# Patient Record
Sex: Female | Born: 1955 | Race: White | Hispanic: No | Marital: Married | Smoking: Never smoker
Health system: Southern US, Community
[De-identification: ages and names within clinical notes are randomized; demographics above are authoritative.]

## PROBLEM LIST (undated history)

## (undated) DIAGNOSIS — F419 Anxiety disorder, unspecified: Secondary | ICD-10-CM

## (undated) DIAGNOSIS — N809 Endometriosis, unspecified: Secondary | ICD-10-CM

## (undated) DIAGNOSIS — Z9889 Other specified postprocedural states: Secondary | ICD-10-CM

## (undated) HISTORY — PX: TONSILLECTOMY: SUR1361

## (undated) HISTORY — PX: ABDOMINAL HYSTERECTOMY: SHX81

---

## 1997-11-20 ENCOUNTER — Ambulatory Visit (HOSPITAL_COMMUNITY): Admission: RE | Admit: 1997-11-20 | Discharge: 1997-11-20 | Payer: Self-pay | Admitting: Obstetrics and Gynecology

## 1998-03-02 ENCOUNTER — Other Ambulatory Visit: Admission: RE | Admit: 1998-03-02 | Discharge: 1998-03-02 | Payer: Self-pay | Admitting: Obstetrics and Gynecology

## 1999-03-27 ENCOUNTER — Other Ambulatory Visit: Admission: RE | Admit: 1999-03-27 | Discharge: 1999-03-27 | Payer: Self-pay | Admitting: Obstetrics and Gynecology

## 1999-04-23 ENCOUNTER — Other Ambulatory Visit: Admission: RE | Admit: 1999-04-23 | Discharge: 1999-04-23 | Payer: Self-pay | Admitting: Obstetrics and Gynecology

## 2000-12-07 ENCOUNTER — Other Ambulatory Visit: Admission: RE | Admit: 2000-12-07 | Discharge: 2000-12-07 | Payer: Self-pay | Admitting: Obstetrics and Gynecology

## 2000-12-07 ENCOUNTER — Encounter (INDEPENDENT_AMBULATORY_CARE_PROVIDER_SITE_OTHER): Payer: Self-pay

## 2001-03-04 ENCOUNTER — Observation Stay (HOSPITAL_COMMUNITY): Admission: RE | Admit: 2001-03-04 | Discharge: 2001-03-05 | Payer: Self-pay | Admitting: Obstetrics and Gynecology

## 2003-02-09 ENCOUNTER — Other Ambulatory Visit: Admission: RE | Admit: 2003-02-09 | Discharge: 2003-02-09 | Payer: Self-pay | Admitting: Obstetrics and Gynecology

## 2003-11-06 ENCOUNTER — Ambulatory Visit (HOSPITAL_COMMUNITY): Admission: RE | Admit: 2003-11-06 | Discharge: 2003-11-06 | Payer: Self-pay | Admitting: Family Medicine

## 2004-02-13 ENCOUNTER — Other Ambulatory Visit: Admission: RE | Admit: 2004-02-13 | Discharge: 2004-02-13 | Payer: Self-pay | Admitting: Obstetrics and Gynecology

## 2005-01-14 ENCOUNTER — Encounter: Admission: RE | Admit: 2005-01-14 | Discharge: 2005-01-14 | Payer: Self-pay | Admitting: Specialist

## 2005-06-20 ENCOUNTER — Other Ambulatory Visit: Admission: RE | Admit: 2005-06-20 | Discharge: 2005-06-20 | Payer: Self-pay | Admitting: Obstetrics and Gynecology

## 2005-06-25 ENCOUNTER — Encounter: Admission: RE | Admit: 2005-06-25 | Discharge: 2005-06-25 | Payer: Self-pay | Admitting: Obstetrics and Gynecology

## 2005-07-11 ENCOUNTER — Other Ambulatory Visit: Admission: RE | Admit: 2005-07-11 | Discharge: 2005-07-11 | Payer: Self-pay | Admitting: Obstetrics and Gynecology

## 2006-07-17 ENCOUNTER — Encounter: Admission: RE | Admit: 2006-07-17 | Discharge: 2006-07-17 | Payer: Self-pay | Admitting: Obstetrics and Gynecology

## 2007-08-31 ENCOUNTER — Encounter: Admission: RE | Admit: 2007-08-31 | Discharge: 2007-08-31 | Payer: Self-pay | Admitting: Obstetrics and Gynecology

## 2008-07-14 ENCOUNTER — Encounter: Admission: RE | Admit: 2008-07-14 | Discharge: 2008-07-14 | Payer: Self-pay | Admitting: Obstetrics and Gynecology

## 2008-07-17 ENCOUNTER — Encounter: Admission: RE | Admit: 2008-07-17 | Discharge: 2008-07-17 | Payer: Self-pay | Admitting: Obstetrics and Gynecology

## 2009-11-21 ENCOUNTER — Encounter: Admission: RE | Admit: 2009-11-21 | Discharge: 2009-11-21 | Payer: Self-pay | Admitting: Obstetrics and Gynecology

## 2010-08-08 ENCOUNTER — Other Ambulatory Visit: Payer: Self-pay | Admitting: Family Medicine

## 2010-08-08 DIAGNOSIS — N6310 Unspecified lump in the right breast, unspecified quadrant: Secondary | ICD-10-CM

## 2010-08-12 ENCOUNTER — Ambulatory Visit
Admission: RE | Admit: 2010-08-12 | Discharge: 2010-08-12 | Disposition: A | Discharge status: Home / Self Care | Payer: Medicare HMO | Source: Ambulatory Visit | Attending: Family Medicine | Admitting: Family Medicine

## 2010-08-12 ENCOUNTER — Other Ambulatory Visit: Payer: Self-pay | Admitting: Family Medicine

## 2010-08-12 DIAGNOSIS — N6310 Unspecified lump in the right breast, unspecified quadrant: Secondary | ICD-10-CM

## 2010-08-30 NOTE — H&P (Signed)
[facility]  Patient:    Kristy Murillo, Kristy Murillo Visit Number: [id number] MRN: [id number]          Service Type: Attending:  Juluis Mire, M.D. Dictated by:   Juluis Mire, M.D. Adm. Date:  03/04/01                           History and Physical  REASON FOR ADMISSION:         The patient is a 55 year old gravida 4 para 2 abortus 2 married white female who presents for laparoscopically-assisted vaginal hysterectomy for management of abnormal uterine bleeding.  HISTORY OF PRESENT ILLNESS:   The patient has had a history of increasing menstrual irregularity.  Her cycles are now every 21 days.  She reports seven days of flow.  Five days are described as heavy.  The amount of flow and its frequency is significant and incapacitating for her.  Her hemoglobin has remained relatively stable.  During this time she is having increasing pain and discomfort associated with her cycle.  She had been laparoscoped back in 1999 with finding of mild pelvic endometriosis.  Her mother does have a history of breast cancer and the patient is somewhat concerned about using hormonal agents such as birth control pills.  Due to the polymenorrhea and menorrhagia she now presents for laparoscopically-assisted vaginal hysterectomy.  Workup including an ultrasound have basically been unremarkable.  Presumptively, we are dealing with recurrent endometriosis and/or uterine adenomyosis.  ALLERGIES:                    No known drug allergies.  MEDICATIONS:                  Zoloft.  PAST MEDICAL HISTORY:         Usual childhood diseases without any significant sequelae.  PREVIOUS SURGICAL HISTORY:    She had a previous laparoscopy/hysteroscopy done in 1992 with the finding of endometriosis.  She has had also a tonsillectomy and adenoidectomy in September 1986.  OBSTETRICAL HISTORY:          She had a TAB in 1980, spontaneous vaginal delivery in 1988, spontaneous abortion in 1991, and  then another spontaneous vaginal delivery in 1993.  SOCIAL HISTORY:               No tobacco or alcohol use.  FAMILY HISTORY:               Noncontributory.  REVIEW OF SYSTEMS:            Noncontributory.  PHYSICAL EXAMINATION:  VITAL SIGNS:                  Patient is afebrile with stable vital signs.  HEENT:                        Patient normocephalic.  Pupils equal, round, and reactive to light and accommodation.  Extraocular movements were intact. Sclerae and conjunctivae were clear.  Oropharynx clear.  NECK:                         Without thyromegaly.  BREASTS:                      No discrete masses.  LUNGS:  Clear.  CARDIOVASCULAR:               Regular rhythm and rate without murmurs or gallops heard.  ABDOMEN:                      Benign.  No mass or organomegaly or tenderness.  PELVIC:                       Normal external genitalia, vaginal mucosa is clear.  Cervix unremarkable.  Uterus upper limits of normal size.  Adnexa unremarkable.  Rectovaginal exam is clear.  EXTREMITIES:                  Trace edema.  NEUROLOGIC:                   Grossly within normal limits.  IMPRESSION:                   Menorrhagia and polymenorrhea probably secondary to adenomyosis and/or pelvic endometriosis.  PLAN:                         The patient to undergo laparoscopically-assisted vaginal hysterectomy.  At her request, the ovaries will be left in place.  She does understand the rare risk of ovarian cancer and that when ovaries are left in place there can continue to be pelvic pain associated with persistent endometriosis.  The risks have been discussed, including the risk of anesthesia; the risk of infection; the risk of hemorrhage that could require transfusion with the risk of AIDS or hepatitis; the risk of injury to adjacent organs including bladder, bowel, or blood vessels that could require further exploratory surgery; the risk of deep venous  thrombosis and pulmonary embolus. The patient professed an understanding of the indications and risks. Dictated by:   Juluis Mire, M.D. Attending:  Juluis Mire, M.D. DD:  03/04/01 TD:  03/04/01 Job: [id number]

## 2010-08-30 NOTE — Op Note (Signed)
[facility]  Patient:    Kristy Murillo, Kristy Murillo Visit Number: [id number] MRN: [id number]          Service Type: OBV Location: [id number] Attending Physician:  Frederich Balding Dictated by:   Juluis Mire, M.D. Proc. Date: 03/04/01 Admit Date:  03/04/2001                             Operative Report  PREOPERATIVE DIAGNOSES:       Pelvic endometriosis with abnormal uterine bleeding.  POSTOPERATIVE DIAGNOSES:      Pelvic endometriosis with abnormal uterine bleeding.  PROCEDURE:                    Laparoscopic assisted vaginal hysterectomy.  SURGEON:                      Juluis Mire, M.D.  ASSISTANT:                    Duke Salvia. Marcelle Overlie, M.D.  ANESTHESIA:                   General endotracheal.  ESTIMATED BLOOD LOSS:         200-300 cc.  PACKS AND DRAINS:             None.  INTRAOPERATIVE BLOOD PLACED:  None.  COMPLICATIONS:                None.  INDICATIONS:                  Are as dictated in the history and physical.  PROCEDURE:                    Patient taken to the OR and placed in supine position.  After a satisfactory level of general endotracheal anesthesia was obtained the patient was placed in the dorsal lithotomy position using Allen stirrups.  The abdomen, perineum, and vagina were prepped out with Betadine and draped as a sterile field.  Bladder was emptied by in-and-out catheterization.  A Hulka tenaculum was then put in place.  A subumbilical incision made with the knife.  The Veress needle was introduced into the abdominal cavity.  The abdomen was insufflated with approximately 3 L of carbon dioxide.  The operating laparoscope was introduced.  There was no evidence of injury to adjacent organs.  A 5 mm trocar was put in place in the suprapubic area in the left lower quadrant.  We made sure to miss the epigastric vessels.  The uterus was upper limits of normal size.  Tubes and ovaries were unremarkable.  She had  several implants of endometriosis at the cul-de-sac and one on the bladder area.  Appendix was visualized and noted to be normal.  Upper abdomen including the liver and tip of the gallbladder were clear.  She did have some pericecal adhesions, but again, the appendix was normal.  At this point in time a 5 mm laparoscope was put in place in the left lower port.  The LigaSure was then brought through the subumbilical port.  We then successfully cauterize and incise both utero-ovarian ligaments on each side as well as the round ligament.  With this we had good separation of the adnexa. We reinserted the subumbilical scope.  We had excellent hemostasis and separation of the adnexa.  The ureters were easily followed  in the course in the pelvis and noted to be uninvolved and the bladder was well out of the way. At this point in time all instruments were removed from the ports.  The abdomen was deflated of its carbon dioxide.  The patients legs were repositioned.  The Hulka tenaculum was then removed. A weighted speculum was placed in the vaginal vault.  Posterior lip of the cervix was grasped with a Christella Hartigan tenaculum.  Cul-de-sac was entered sharply. Both uterosacral ligaments were clamped, cut, and suture ligated with 0 Vicryl.  The reflection of the vaginal mucosa anteriorly was incised and the bladder was dissected superiorly.  Paracervical tissue was then clamped, cut, and suture ligated with 0 Vicryl.  The vesicouterine space was entered sharply and the retractor was put in place to retract the bladder superiorly.  Using the clamp, cut, and tie technique with suture ligatures of 0 Vicryl the parametrium was serially separated from the sides of the uterus.  The uterus was then flipped.  The remaining pedicle was clamped and cut.  Uterus was passed off the operative field.  Held pedicles secured with free ties of 0 Vicryl.  Uterosacral plication stitch of 0 Vicryl was put in place and  tied down and the held uterosacral ligaments were also tied together.  The vaginal cuff was then closed with figure-of-eights of 0 Vicryl in a vertical fashion. We had good hemostasis.  A Foley was placed to straight drain with retrieval of adequate amount of clear urine.  Sponge and sponge stick was then placed in the vaginal vault.  The legs were repositioned.  Abdomen was reinflated of its carbon dioxide.  Laparoscope was reintroduced along with suction irrigation.  She had some slight oozing from the vaginal cuff brought under control with the bipolar.  The adnexa appeared to be hemostatically intact.  There were a couple implants of endometriosis in the cul-de-sac that we cauterized.  We then instilled some Marcaine into the pelvic cavity.  The abdomen was deflated of its carbon dioxide and all trocars removed.  Subumbilical incision closed with interrupted subcuticulars of 4-0 Vicryl.  Suprapubic incision closed with Steri-Strips.  Sponge and sponge stick was removed from the vaginal vault.  Patient taken out of the dorsal lithotomy position.  Once alert and extubated transferred to recovery room in good condition.  Sponge, instrument, needle counts reported as correct by circulated nurse x 2. Dictated by:   Juluis Mire, M.D. Attending Physician:  Frederich Balding DD:  03/04/01 TD:  03/04/01 Job: [id number]

## 2010-08-30 NOTE — Discharge Summary (Signed)
[facility] [place]  Patient:    Kristy Murillo, Kristy Murillo Visit Number: [id number] MRN: [id number]          Service Type: OBV Location: 9300 [id number] Attending Physician:  Frederich Balding Dictated by:   Juluis Mire, M.D. Admit Date:  03/04/2001 Disc. Date: 03/05/01                             Discharge Summary  ADMISSION DIAGNOSIS:  Pelvic endometriosis.  DISCHARGE DIAGNOSES:  Pelvic endometriosis.  OPERATION:  Laparoscopic assisted vaginal hysterectomy.  For complete history and physical, please see dictated note.  HOSPITAL COURSE:  The patient underwent the above noted surgery.  Pathology is still pending.  Postoperatively did well.  Postoperative hemoglobin is 10.3. Discharged home on first postoperative day.  At that time was afebrile with stable vital signs.  Abdominal examination was benign.  No active vaginal bleeding, urine output had been adequate.  COMPLICATIONS:  None.  CONDITION ON DISCHARGE:  Discharged home in stable condition.  DISPOSITION:  The patient is to avoid heavy lifting, vaginal entrance, or driving a car.  She is to watch for signs of infection, nausea, vomiting, increasing abdominal pain, or active vaginal bleeding.  FOLLOWUP:  In the office in one week.  DISCHARGE MEDICATIONS: 1. Tylox p.r.n. pain. 2. Phenergan p.r.n. nausea. Dictated by:   Juluis Mire, M.D. Attending Physician:  Frederich Balding DD:  03/05/01 TD:  03/05/01 Job: [id number]

## 2010-12-20 ENCOUNTER — Other Ambulatory Visit: Payer: Self-pay | Admitting: Obstetrics and Gynecology

## 2010-12-20 DIAGNOSIS — N6009 Solitary cyst of unspecified breast: Secondary | ICD-10-CM

## 2010-12-24 ENCOUNTER — Ambulatory Visit
Admission: RE | Admit: 2010-12-24 | Discharge: 2010-12-24 | Disposition: A | Discharge status: Home / Self Care | Payer: Private Health Insurance - Indemnity | Source: Ambulatory Visit | Attending: Obstetrics and Gynecology | Admitting: Obstetrics and Gynecology

## 2010-12-24 ENCOUNTER — Other Ambulatory Visit: Payer: Self-pay | Admitting: Obstetrics and Gynecology

## 2010-12-24 DIAGNOSIS — N6009 Solitary cyst of unspecified breast: Secondary | ICD-10-CM

## 2011-06-27 ENCOUNTER — Ambulatory Visit
Admission: RE | Admit: 2011-06-27 | Discharge: 2011-06-27 | Disposition: A | Discharge status: Home / Self Care | Payer: Self-pay | Source: Ambulatory Visit | Attending: Obstetrics and Gynecology | Admitting: Obstetrics and Gynecology

## 2011-06-27 ENCOUNTER — Other Ambulatory Visit: Payer: Self-pay | Admitting: Obstetrics and Gynecology

## 2011-06-27 DIAGNOSIS — N63 Unspecified lump in unspecified breast: Secondary | ICD-10-CM

## 2011-07-11 ENCOUNTER — Other Ambulatory Visit: Payer: BC Managed Care – PPO

## 2011-07-17 ENCOUNTER — Ambulatory Visit
Admission: RE | Admit: 2011-07-17 | Discharge: 2011-07-17 | Disposition: A | Discharge status: Home / Self Care | Payer: BC Managed Care – PPO | Source: Ambulatory Visit | Attending: Obstetrics and Gynecology | Admitting: Obstetrics and Gynecology

## 2011-07-17 DIAGNOSIS — N63 Unspecified lump in unspecified breast: Secondary | ICD-10-CM

## 2012-06-25 ENCOUNTER — Other Ambulatory Visit: Payer: Self-pay

## 2012-06-25 DIAGNOSIS — Z1231 Encounter for screening mammogram for malignant neoplasm of breast: Secondary | ICD-10-CM

## 2012-07-29 ENCOUNTER — Ambulatory Visit
Admission: RE | Admit: 2012-07-29 | Discharge: 2012-07-29 | Disposition: A | Discharge status: Home / Self Care | Payer: BC Managed Care – PPO | Source: Ambulatory Visit

## 2012-07-29 DIAGNOSIS — Z1231 Encounter for screening mammogram for malignant neoplasm of breast: Secondary | ICD-10-CM

## 2013-12-30 ENCOUNTER — Other Ambulatory Visit: Payer: Self-pay | Admitting: Family Medicine

## 2013-12-30 ENCOUNTER — Ambulatory Visit
Admission: RE | Admit: 2013-12-30 | Discharge: 2013-12-30 | Disposition: A | Discharge status: Home / Self Care | Payer: BC Managed Care – PPO | Source: Ambulatory Visit | Attending: Family Medicine | Admitting: Family Medicine

## 2013-12-30 DIAGNOSIS — M79645 Pain in left finger(s): Secondary | ICD-10-CM

## 2014-01-17 ENCOUNTER — Other Ambulatory Visit: Payer: Self-pay

## 2014-01-17 DIAGNOSIS — Z1239 Encounter for other screening for malignant neoplasm of breast: Secondary | ICD-10-CM

## 2014-01-23 ENCOUNTER — Ambulatory Visit
Admission: RE | Admit: 2014-01-23 | Discharge: 2014-01-23 | Disposition: A | Discharge status: Home / Self Care | Payer: BC Managed Care – PPO | Source: Ambulatory Visit

## 2014-01-23 DIAGNOSIS — Z1239 Encounter for other screening for malignant neoplasm of breast: Secondary | ICD-10-CM

## 2015-08-09 DIAGNOSIS — D519 Vitamin B12 deficiency anemia, unspecified: Secondary | ICD-10-CM | POA: Diagnosis not present

## 2015-08-09 DIAGNOSIS — E7800 Pure hypercholesterolemia, unspecified: Secondary | ICD-10-CM | POA: Diagnosis not present

## 2015-08-09 DIAGNOSIS — E039 Hypothyroidism, unspecified: Secondary | ICD-10-CM | POA: Diagnosis not present

## 2015-08-09 DIAGNOSIS — E279 Disorder of adrenal gland, unspecified: Secondary | ICD-10-CM | POA: Diagnosis not present

## 2015-10-12 DIAGNOSIS — T148 Other injury of unspecified body region: Secondary | ICD-10-CM | POA: Diagnosis not present

## 2015-10-13 DIAGNOSIS — S0011XA Contusion of right eyelid and periocular area, initial encounter: Secondary | ICD-10-CM | POA: Diagnosis not present

## 2016-02-18 DIAGNOSIS — M9903 Segmental and somatic dysfunction of lumbar region: Secondary | ICD-10-CM | POA: Diagnosis not present

## 2016-02-18 DIAGNOSIS — M9905 Segmental and somatic dysfunction of pelvic region: Secondary | ICD-10-CM | POA: Diagnosis not present

## 2016-02-18 DIAGNOSIS — M9901 Segmental and somatic dysfunction of cervical region: Secondary | ICD-10-CM | POA: Diagnosis not present

## 2016-02-18 DIAGNOSIS — M5126 Other intervertebral disc displacement, lumbar region: Secondary | ICD-10-CM | POA: Diagnosis not present

## 2016-03-21 DIAGNOSIS — D2262 Melanocytic nevi of left upper limb, including shoulder: Secondary | ICD-10-CM | POA: Diagnosis not present

## 2016-03-21 DIAGNOSIS — L570 Actinic keratosis: Secondary | ICD-10-CM | POA: Diagnosis not present

## 2016-03-21 DIAGNOSIS — L821 Other seborrheic keratosis: Secondary | ICD-10-CM | POA: Diagnosis not present

## 2016-03-21 DIAGNOSIS — L819 Disorder of pigmentation, unspecified: Secondary | ICD-10-CM | POA: Diagnosis not present

## 2016-03-24 DIAGNOSIS — Z1231 Encounter for screening mammogram for malignant neoplasm of breast: Secondary | ICD-10-CM | POA: Diagnosis not present

## 2016-03-24 DIAGNOSIS — Z01419 Encounter for gynecological examination (general) (routine) without abnormal findings: Secondary | ICD-10-CM | POA: Diagnosis not present

## 2016-03-24 DIAGNOSIS — Z6821 Body mass index (BMI) 21.0-21.9, adult: Secondary | ICD-10-CM | POA: Diagnosis not present

## 2016-04-03 ENCOUNTER — Other Ambulatory Visit: Payer: Self-pay | Admitting: Obstetrics and Gynecology

## 2016-04-03 DIAGNOSIS — Z803 Family history of malignant neoplasm of breast: Secondary | ICD-10-CM

## 2016-04-04 ENCOUNTER — Ambulatory Visit
Admission: RE | Admit: 2016-04-04 | Discharge: 2016-04-04 | Disposition: A | Discharge status: Home / Self Care | Payer: BLUE CROSS/BLUE SHIELD | Source: Ambulatory Visit | Attending: Obstetrics and Gynecology | Admitting: Obstetrics and Gynecology

## 2016-04-04 DIAGNOSIS — N6011 Diffuse cystic mastopathy of right breast: Secondary | ICD-10-CM | POA: Diagnosis not present

## 2016-04-04 DIAGNOSIS — N6012 Diffuse cystic mastopathy of left breast: Secondary | ICD-10-CM | POA: Diagnosis not present

## 2016-04-04 DIAGNOSIS — Z803 Family history of malignant neoplasm of breast: Secondary | ICD-10-CM

## 2016-04-04 MED ORDER — GADOBENATE DIMEGLUMINE 529 MG/ML IV SOLN
15.0000 mL | Freq: Once | INTRAVENOUS | Status: AC | PRN
  Administered 2016-04-04: 13 mL via INTRAVENOUS

## 2016-05-01 ENCOUNTER — Emergency Department (HOSPITAL_COMMUNITY): Payer: BLUE CROSS/BLUE SHIELD | Admitting: Anesthesiology

## 2016-05-01 ENCOUNTER — Encounter (HOSPITAL_COMMUNITY): Admission: EM | Disposition: A | Discharge status: Home / Self Care | Payer: Self-pay | Source: Home / Self Care

## 2016-05-01 ENCOUNTER — Encounter (HOSPITAL_COMMUNITY): Payer: Self-pay | Admitting: Emergency Medicine

## 2016-05-01 ENCOUNTER — Inpatient Hospital Stay (HOSPITAL_COMMUNITY)
Admission: EM | Admit: 2016-05-01 | Discharge: 2016-05-06 | DRG: 330 | Disposition: A | Discharge status: Home / Self Care | Payer: BLUE CROSS/BLUE SHIELD | Attending: Surgery | Admitting: Surgery

## 2016-05-01 ENCOUNTER — Other Ambulatory Visit: Payer: Self-pay | Admitting: Internal Medicine

## 2016-05-01 ENCOUNTER — Emergency Department (HOSPITAL_COMMUNITY): Payer: BLUE CROSS/BLUE SHIELD

## 2016-05-01 DIAGNOSIS — R1110 Vomiting, unspecified: Secondary | ICD-10-CM | POA: Diagnosis not present

## 2016-05-01 DIAGNOSIS — F419 Anxiety disorder, unspecified: Secondary | ICD-10-CM | POA: Diagnosis not present

## 2016-05-01 DIAGNOSIS — Z90710 Acquired absence of both cervix and uterus: Secondary | ICD-10-CM

## 2016-05-01 DIAGNOSIS — Z791 Long term (current) use of non-steroidal anti-inflammatories (NSAID): Secondary | ICD-10-CM

## 2016-05-01 DIAGNOSIS — K562 Volvulus: Secondary | ICD-10-CM | POA: Diagnosis not present

## 2016-05-01 DIAGNOSIS — R112 Nausea with vomiting, unspecified: Secondary | ICD-10-CM | POA: Diagnosis not present

## 2016-05-01 DIAGNOSIS — D62 Acute posthemorrhagic anemia: Secondary | ICD-10-CM | POA: Diagnosis not present

## 2016-05-01 DIAGNOSIS — R109 Unspecified abdominal pain: Secondary | ICD-10-CM | POA: Diagnosis not present

## 2016-05-01 DIAGNOSIS — K5900 Constipation, unspecified: Secondary | ICD-10-CM | POA: Diagnosis not present

## 2016-05-01 DIAGNOSIS — K5669 Other partial intestinal obstruction: Secondary | ICD-10-CM | POA: Diagnosis not present

## 2016-05-01 HISTORY — DX: Anxiety disorder, unspecified: F41.9

## 2016-05-01 HISTORY — PX: COLON RESECTION: SHX5231

## 2016-05-01 HISTORY — DX: Endometriosis, unspecified: N80.9

## 2016-05-01 LAB — COMPREHENSIVE METABOLIC PANEL
ALT: 21 U/L (ref 14–54)
ANION GAP: 11 (ref 5–15)
AST: 30 U/L (ref 15–41)
Albumin: 4.9 g/dL (ref 3.5–5.0)
Alkaline Phosphatase: 52 U/L (ref 38–126)
BUN: 16 mg/dL (ref 6–20)
CO2: 24 mmol/L (ref 22–32)
Calcium: 9.7 mg/dL (ref 8.9–10.3)
Chloride: 99 mmol/L — ABNORMAL LOW (ref 101–111)
Creatinine, Ser: 0.76 mg/dL (ref 0.44–1.00)
Glucose, Bld: 130 mg/dL — ABNORMAL HIGH (ref 65–99)
POTASSIUM: 3.3 mmol/L — AB (ref 3.5–5.1)
Sodium: 134 mmol/L — ABNORMAL LOW (ref 135–145)
Total Bilirubin: 1 mg/dL (ref 0.3–1.2)
Total Protein: 8.1 g/dL (ref 6.5–8.1)

## 2016-05-01 LAB — CBC WITH DIFFERENTIAL/PLATELET
BASOS ABS: 0 10*3/uL (ref 0.0–0.1)
Basophils Relative: 0 %
EOS PCT: 0 %
Eosinophils Absolute: 0.1 10*3/uL (ref 0.0–0.7)
HEMATOCRIT: 41.5 % (ref 36.0–46.0)
Hemoglobin: 14.3 g/dL (ref 12.0–15.0)
Lymphocytes Relative: 17 %
Lymphs Abs: 2 10*3/uL (ref 0.7–4.0)
MCH: 30.6 pg (ref 26.0–34.0)
MCHC: 34.5 g/dL (ref 30.0–36.0)
MCV: 88.7 fL (ref 78.0–100.0)
Monocytes Absolute: 0.7 10*3/uL (ref 0.1–1.0)
Monocytes Relative: 6 %
NEUTROS ABS: 9.2 10*3/uL — AB (ref 1.7–7.7)
Neutrophils Relative %: 77 %
PLATELETS: 279 10*3/uL (ref 150–400)
RBC: 4.68 MIL/uL (ref 3.87–5.11)
RDW: 13.4 % (ref 11.5–15.5)
WBC: 11.9 10*3/uL — AB (ref 4.0–10.5)

## 2016-05-01 LAB — LIPASE, BLOOD: LIPASE: 40 U/L (ref 11–51)

## 2016-05-01 SURGERY — COLON RESECTION
Anesthesia: General

## 2016-05-01 MED ORDER — ROCURONIUM BROMIDE 50 MG/5ML IV SOSY
PREFILLED_SYRINGE | INTRAVENOUS | Status: AC
  Filled 2016-05-01: qty 5

## 2016-05-01 MED ORDER — IOPAMIDOL (ISOVUE-300) INJECTION 61%
INTRAVENOUS | Status: AC
  Filled 2016-05-01: qty 100

## 2016-05-01 MED ORDER — 0.9 % SODIUM CHLORIDE (POUR BTL) OPTIME
TOPICAL | Status: DC | PRN
  Administered 2016-05-01: 2000 mL

## 2016-05-01 MED ORDER — GLYCOPYRROLATE 0.2 MG/ML IJ SOLN
INTRAMUSCULAR | Status: DC | PRN
  Administered 2016-05-01: 0.4 mg via INTRAVENOUS

## 2016-05-01 MED ORDER — DEXAMETHASONE SODIUM PHOSPHATE 10 MG/ML IJ SOLN
INTRAMUSCULAR | Status: DC | PRN
  Administered 2016-05-01: 10 mg via INTRAVENOUS

## 2016-05-01 MED ORDER — FENTANYL CITRATE (PF) 100 MCG/2ML IJ SOLN
50.0000 ug | Freq: Once | INTRAMUSCULAR | Status: AC
  Administered 2016-05-01: 50 ug via INTRAVENOUS
  Filled 2016-05-01: qty 2

## 2016-05-01 MED ORDER — LIDOCAINE 2% (20 MG/ML) 5 ML SYRINGE
INTRAMUSCULAR | Status: AC
  Filled 2016-05-01: qty 5

## 2016-05-01 MED ORDER — ONDANSETRON HCL 4 MG/2ML IJ SOLN
4.0000 mg | Freq: Four times a day (QID) | INTRAMUSCULAR | Status: DC | PRN
  Administered 2016-05-02: 4 mg via INTRAVENOUS

## 2016-05-01 MED ORDER — CEFOTETAN DISODIUM 2 G IJ SOLR
2.0000 g | Freq: Two times a day (BID) | INTRAMUSCULAR | Status: DC
  Administered 2016-05-01: 2 g via INTRAVENOUS

## 2016-05-01 MED ORDER — ONDANSETRON HCL 4 MG/2ML IJ SOLN
INTRAMUSCULAR | Status: DC | PRN
  Administered 2016-05-01: 4 mg via INTRAVENOUS

## 2016-05-01 MED ORDER — IOPAMIDOL (ISOVUE-300) INJECTION 61%
30.0000 mL | Freq: Once | INTRAVENOUS | Status: AC | PRN
  Administered 2016-05-01: 30 mL via ORAL

## 2016-05-01 MED ORDER — ACETAMINOPHEN 10 MG/ML IV SOLN
1000.0000 mg | Freq: Once | INTRAVENOUS | Status: AC
  Administered 2016-05-01: 1000 mg via INTRAVENOUS

## 2016-05-01 MED ORDER — DIPHENHYDRAMINE HCL 12.5 MG/5ML PO ELIX: 12.5000 mg | ORAL_SOLUTION | Freq: Four times a day (QID) | ORAL | Status: DC | PRN

## 2016-05-01 MED ORDER — PROPOFOL 10 MG/ML IV BOLUS
INTRAVENOUS | Status: DC | PRN
  Administered 2016-05-01: 200 mg via INTRAVENOUS

## 2016-05-01 MED ORDER — ROCURONIUM BROMIDE 10 MG/ML (PF) SYRINGE
PREFILLED_SYRINGE | INTRAVENOUS | Status: DC | PRN
  Administered 2016-05-01: 10 mg via INTRAVENOUS
  Administered 2016-05-01: 40 mg via INTRAVENOUS
  Administered 2016-05-01: 10 mg via INTRAVENOUS

## 2016-05-01 MED ORDER — SCOPOLAMINE 1 MG/3DAYS TD PT72
MEDICATED_PATCH | TRANSDERMAL | Status: DC | PRN
  Administered 2016-05-01: 1 via TRANSDERMAL

## 2016-05-01 MED ORDER — MORPHINE SULFATE 2 MG/ML IV SOLN
INTRAVENOUS | Status: DC
  Administered 2016-05-01: 17:00:00 via INTRAVENOUS
  Administered 2016-05-01: 6 mg via INTRAVENOUS
  Administered 2016-05-02: 9 mg via INTRAVENOUS
  Administered 2016-05-02: 6 mg via INTRAVENOUS
  Administered 2016-05-02 (×2): 2 mg via INTRAVENOUS
  Administered 2016-05-02: 1 mg via INTRAVENOUS
  Administered 2016-05-02: 2 mg via INTRAVENOUS
  Administered 2016-05-02: 3.5 mg via INTRAVENOUS
  Administered 2016-05-03: 0 mg via INTRAVENOUS
  Administered 2016-05-03: 3 mg via INTRAVENOUS
  Administered 2016-05-03 – 2016-05-04 (×2): 1 mg via INTRAVENOUS
  Administered 2016-05-04: 0 mg via INTRAVENOUS

## 2016-05-01 MED ORDER — PROMETHAZINE HCL 25 MG/ML IJ SOLN
12.5000 mg | Freq: Once | INTRAMUSCULAR | Status: DC
  Filled 2016-05-01: qty 1

## 2016-05-01 MED ORDER — FENTANYL CITRATE (PF) 100 MCG/2ML IJ SOLN
100.0000 ug | Freq: Once | INTRAMUSCULAR | Status: AC
  Administered 2016-05-01: 100 ug via INTRAVENOUS
  Filled 2016-05-01: qty 2

## 2016-05-01 MED ORDER — NALOXONE HCL 0.4 MG/ML IJ SOLN: 0.4000 mg | INTRAMUSCULAR | Status: DC | PRN

## 2016-05-01 MED ORDER — KCL IN DEXTROSE-NACL 20-5-0.45 MEQ/L-%-% IV SOLN
INTRAVENOUS | Status: AC
  Administered 2016-05-01: 1000 mL via INTRAVENOUS
  Filled 2016-05-01: qty 1000

## 2016-05-01 MED ORDER — KCL IN DEXTROSE-NACL 20-5-0.45 MEQ/L-%-% IV SOLN
INTRAVENOUS | Status: DC
  Administered 2016-05-01: 1000 mL via INTRAVENOUS
  Administered 2016-05-02 – 2016-05-05 (×6): via INTRAVENOUS
  Filled 2016-05-01 (×10): qty 1000

## 2016-05-01 MED ORDER — ONDANSETRON HCL 4 MG/2ML IJ SOLN
4.0000 mg | Freq: Four times a day (QID) | INTRAMUSCULAR | Status: DC | PRN
  Filled 2016-05-01: qty 2

## 2016-05-01 MED ORDER — GLYCOPYRROLATE 0.2 MG/ML IV SOSY
PREFILLED_SYRINGE | INTRAVENOUS | Status: AC
  Filled 2016-05-01: qty 3

## 2016-05-01 MED ORDER — IOPAMIDOL (ISOVUE-300) INJECTION 61%
100.0000 mL | Freq: Once | INTRAVENOUS | Status: AC | PRN
  Administered 2016-05-01: 100 mL via INTRAVENOUS

## 2016-05-01 MED ORDER — FENTANYL CITRATE (PF) 100 MCG/2ML IJ SOLN
INTRAMUSCULAR | Status: AC
  Administered 2016-05-01: 25 ug via INTRAVENOUS
  Filled 2016-05-01: qty 2

## 2016-05-01 MED ORDER — KETOROLAC TROMETHAMINE 30 MG/ML IJ SOLN
INTRAMUSCULAR | Status: AC
  Administered 2016-05-01: 30 mg via INTRAVENOUS
  Filled 2016-05-01: qty 1

## 2016-05-01 MED ORDER — PROPOFOL 10 MG/ML IV BOLUS
INTRAVENOUS | Status: AC
  Filled 2016-05-01: qty 20

## 2016-05-01 MED ORDER — SCOPOLAMINE 1 MG/3DAYS TD PT72
MEDICATED_PATCH | TRANSDERMAL | Status: AC
  Filled 2016-05-01: qty 1

## 2016-05-01 MED ORDER — DIPHENHYDRAMINE HCL 50 MG/ML IJ SOLN: 25.0000 mg | Freq: Four times a day (QID) | INTRAMUSCULAR | Status: DC | PRN

## 2016-05-01 MED ORDER — CEFOTETAN DISODIUM-DEXTROSE 2-2.08 GM-% IV SOLR
INTRAVENOUS | Status: AC
  Filled 2016-05-01: qty 50

## 2016-05-01 MED ORDER — ONDANSETRON HCL 4 MG/2ML IJ SOLN
INTRAMUSCULAR | Status: AC
  Filled 2016-05-01: qty 2

## 2016-05-01 MED ORDER — SUGAMMADEX SODIUM 200 MG/2ML IV SOLN
INTRAVENOUS | Status: AC
  Filled 2016-05-01: qty 2

## 2016-05-01 MED ORDER — ONDANSETRON 4 MG PO TBDP
4.0000 mg | ORAL_TABLET | Freq: Four times a day (QID) | ORAL | Status: DC | PRN
  Administered 2016-05-03 (×2): 4 mg via ORAL
  Filled 2016-05-01 (×2): qty 1

## 2016-05-01 MED ORDER — IOPAMIDOL (ISOVUE-300) INJECTION 61%
INTRAVENOUS | Status: AC
  Administered 2016-05-01: 30 mL via ORAL
  Filled 2016-05-01: qty 30

## 2016-05-01 MED ORDER — SODIUM CHLORIDE 0.9 % IV BOLUS (SEPSIS)
1000.0000 mL | Freq: Once | INTRAVENOUS | Status: AC
  Administered 2016-05-01: 1000 mL via INTRAVENOUS

## 2016-05-01 MED ORDER — ACETAMINOPHEN 10 MG/ML IV SOLN
1000.0000 mg | Freq: Four times a day (QID) | INTRAVENOUS | Status: AC
  Administered 2016-05-01 – 2016-05-02 (×4): 1000 mg via INTRAVENOUS
  Filled 2016-05-01 (×4): qty 100

## 2016-05-01 MED ORDER — LIDOCAINE 2% (20 MG/ML) 5 ML SYRINGE
INTRAMUSCULAR | Status: DC | PRN
  Administered 2016-05-01: 100 mg via INTRAVENOUS

## 2016-05-01 MED ORDER — DEXAMETHASONE SODIUM PHOSPHATE 10 MG/ML IJ SOLN
INTRAMUSCULAR | Status: AC
  Filled 2016-05-01: qty 1

## 2016-05-01 MED ORDER — PHENYLEPHRINE HCL 10 MG/ML IJ SOLN
INTRAVENOUS | Status: DC | PRN
  Administered 2016-05-01: 25 ug/min via INTRAVENOUS

## 2016-05-01 MED ORDER — HYDROMORPHONE HCL 1 MG/ML IJ SOLN
INTRAMUSCULAR | Status: AC
  Administered 2016-05-01: 0.5 mg via INTRAVENOUS
  Filled 2016-05-01: qty 1

## 2016-05-01 MED ORDER — KETOROLAC TROMETHAMINE 30 MG/ML IJ SOLN
30.0000 mg | Freq: Once | INTRAMUSCULAR | Status: AC
  Administered 2016-05-01: 30 mg via INTRAVENOUS

## 2016-05-01 MED ORDER — HYDROMORPHONE HCL 1 MG/ML IJ SOLN: 0.5000 mg | INTRAMUSCULAR | Status: DC | PRN

## 2016-05-01 MED ORDER — HYDROMORPHONE HCL 1 MG/ML IJ SOLN
0.2500 mg | INTRAMUSCULAR | Status: DC | PRN
  Administered 2016-05-01: 0.5 mg via INTRAVENOUS
  Administered 2016-05-01 (×2): 0.25 mg via INTRAVENOUS

## 2016-05-01 MED ORDER — FENTANYL CITRATE (PF) 100 MCG/2ML IJ SOLN
INTRAMUSCULAR | Status: AC
  Filled 2016-05-01: qty 2

## 2016-05-01 MED ORDER — DIPHENHYDRAMINE HCL 25 MG PO CAPS: 25.0000 mg | ORAL_CAPSULE | Freq: Four times a day (QID) | ORAL | Status: DC | PRN

## 2016-05-01 MED ORDER — SODIUM CHLORIDE 0.9% FLUSH: 9.0000 mL | INTRAVENOUS | Status: DC | PRN

## 2016-05-01 MED ORDER — POTASSIUM CHLORIDE CRYS ER 20 MEQ PO TBCR
40.0000 meq | EXTENDED_RELEASE_TABLET | Freq: Once | ORAL | Status: DC
Start: 2016-05-01 — End: 2016-05-01
  Filled 2016-05-01: qty 2

## 2016-05-01 MED ORDER — MIDAZOLAM HCL 2 MG/2ML IJ SOLN
INTRAMUSCULAR | Status: AC
  Filled 2016-05-01: qty 2

## 2016-05-01 MED ORDER — PHENYLEPHRINE 40 MCG/ML (10ML) SYRINGE FOR IV PUSH (FOR BLOOD PRESSURE SUPPORT)
PREFILLED_SYRINGE | INTRAVENOUS | Status: DC | PRN
  Administered 2016-05-01 (×2): 80 ug via INTRAVENOUS
  Administered 2016-05-01 (×2): 160 ug via INTRAVENOUS

## 2016-05-01 MED ORDER — PROMETHAZINE HCL 25 MG/ML IJ SOLN
6.2500 mg | INTRAMUSCULAR | Status: DC | PRN
  Administered 2016-05-01: 6.25 mg via INTRAVENOUS

## 2016-05-01 MED ORDER — ACETAMINOPHEN 10 MG/ML IV SOLN
INTRAVENOUS | Status: AC
  Administered 2016-05-01: 1000 mg via INTRAVENOUS
  Filled 2016-05-01: qty 100

## 2016-05-01 MED ORDER — FENTANYL CITRATE (PF) 100 MCG/2ML IJ SOLN
INTRAMUSCULAR | Status: DC | PRN
  Administered 2016-05-01 (×4): 50 ug via INTRAVENOUS

## 2016-05-01 MED ORDER — SUCCINYLCHOLINE CHLORIDE 200 MG/10ML IV SOSY
PREFILLED_SYRINGE | INTRAVENOUS | Status: DC | PRN
  Administered 2016-05-01: 120 mg via INTRAVENOUS

## 2016-05-01 MED ORDER — SUGAMMADEX SODIUM 200 MG/2ML IV SOLN
INTRAVENOUS | Status: DC | PRN
  Administered 2016-05-01: 200 mg via INTRAVENOUS

## 2016-05-01 MED ORDER — ENOXAPARIN SODIUM 40 MG/0.4ML ~~LOC~~ SOLN: 40.0000 mg | SUBCUTANEOUS | Status: DC

## 2016-05-01 MED ORDER — ONDANSETRON 4 MG PO TBDP: 4.0000 mg | ORAL_TABLET | Freq: Four times a day (QID) | ORAL | Status: DC | PRN

## 2016-05-01 MED ORDER — SODIUM CHLORIDE 0.9 % IV SOLN: INTRAVENOUS | Status: DC

## 2016-05-01 MED ORDER — DIPHENHYDRAMINE HCL 50 MG/ML IJ SOLN: 12.5000 mg | Freq: Four times a day (QID) | INTRAMUSCULAR | Status: DC | PRN

## 2016-05-01 MED ORDER — SUCCINYLCHOLINE CHLORIDE 200 MG/10ML IV SOSY
PREFILLED_SYRINGE | INTRAVENOUS | Status: AC
  Filled 2016-05-01: qty 10

## 2016-05-01 MED ORDER — FENTANYL CITRATE (PF) 100 MCG/2ML IJ SOLN
25.0000 ug | INTRAMUSCULAR | Status: DC | PRN
  Administered 2016-05-01 (×4): 25 ug via INTRAVENOUS
  Administered 2016-05-01: 50 ug via INTRAVENOUS

## 2016-05-01 MED ORDER — ONDANSETRON HCL 4 MG/2ML IJ SOLN
4.0000 mg | Freq: Once | INTRAMUSCULAR | Status: AC
  Administered 2016-05-01: 4 mg via INTRAVENOUS
  Filled 2016-05-01: qty 2

## 2016-05-01 MED ORDER — ONDANSETRON HCL 4 MG/2ML IJ SOLN: 4.0000 mg | Freq: Four times a day (QID) | INTRAMUSCULAR | Status: DC | PRN

## 2016-05-01 MED ORDER — MIDAZOLAM HCL 5 MG/5ML IJ SOLN
INTRAMUSCULAR | Status: DC | PRN
  Administered 2016-05-01: 2 mg via INTRAVENOUS

## 2016-05-01 MED ORDER — LACTATED RINGERS IV SOLN
INTRAVENOUS | Status: DC | PRN
  Administered 2016-05-01: 14:00:00 via INTRAVENOUS
  Administered 2016-05-01: 1000 mL

## 2016-05-01 SURGICAL SUPPLY — 47 items
APPLICATOR COTTON TIP 6IN STRL (MISCELLANEOUS) ×6 IMPLANT
BLADE EXTENDED COATED 6.5IN (ELECTRODE) IMPLANT
BLADE HEX COATED 2.75 (ELECTRODE) ×3 IMPLANT
CHLORAPREP W/TINT 26ML (MISCELLANEOUS) ×3 IMPLANT
CLIP TI LARGE 6 (CLIP) IMPLANT
COVER SURGICAL LIGHT HANDLE (MISCELLANEOUS) ×3 IMPLANT
DRAPE LAPAROSCOPIC ABDOMINAL (DRAPES) ×3 IMPLANT
ELECT REM PT RETURN 9FT ADLT (ELECTROSURGICAL) ×3
ELECTRODE REM PT RTRN 9FT ADLT (ELECTROSURGICAL) ×1 IMPLANT
EVACUATOR DRAINAGE 10X20 100CC (DRAIN) IMPLANT
EVACUATOR SILICONE 100CC (DRAIN)
GAUZE SPONGE 4X4 12PLY STRL (GAUZE/BANDAGES/DRESSINGS) ×3 IMPLANT
GLOVE BIOGEL PI IND STRL 7.0 (GLOVE) ×1 IMPLANT
GLOVE BIOGEL PI INDICATOR 7.0 (GLOVE) ×2
GLOVE SURG ORTHO 8.0 STRL STRW (GLOVE) ×6 IMPLANT
GOWN STRL REUS W/TWL LRG LVL3 (GOWN DISPOSABLE) ×3 IMPLANT
GOWN STRL REUS W/TWL XL LVL3 (GOWN DISPOSABLE) ×6 IMPLANT
HANDLE SUCTION POOLE (INSTRUMENTS) IMPLANT
LEGGING LITHOTOMY PAIR STRL (DRAPES) IMPLANT
NS IRRIG 1000ML POUR BTL (IV SOLUTION) ×6 IMPLANT
PACK COLON (CUSTOM PROCEDURE TRAY) ×3 IMPLANT
PACK GENERAL/GYN (CUSTOM PROCEDURE TRAY) ×3 IMPLANT
RELOAD PROXIMATE 75MM BLUE (ENDOMECHANICALS) ×6 IMPLANT
RELOAD STAPLE 75 3.8 BLU REG (ENDOMECHANICALS) IMPLANT
SEALER TISSUE X1 CVD JAW (INSTRUMENTS) ×2 IMPLANT
SHEARS HARMONIC ACE PLUS 36CM (ENDOMECHANICALS) IMPLANT
SPONGE LAP 18X18 X RAY DECT (DISPOSABLE) ×2 IMPLANT
STAPLER GUN LINEAR PROX 60 (STAPLE) ×2 IMPLANT
STAPLER PROXIMATE 75MM BLUE (STAPLE) ×2 IMPLANT
STAPLER VISISTAT 35W (STAPLE) ×5 IMPLANT
SUCTION POOLE HANDLE (INSTRUMENTS) ×3
SUT ETHILON 3 0 PS 1 (SUTURE) IMPLANT
SUT NOV 1 T60/GS (SUTURE) IMPLANT
SUT NOVA NAB DX-16 0-1 5-0 T12 (SUTURE) IMPLANT
SUT NOVA T20/GS 25 (SUTURE) IMPLANT
SUT PDS AB 0 CT1 36 (SUTURE) ×6 IMPLANT
SUT SILK 2 0 (SUTURE) ×3
SUT SILK 2 0 SH CR/8 (SUTURE) ×3 IMPLANT
SUT SILK 2 0SH CR/8 30 (SUTURE) IMPLANT
SUT SILK 2-0 18XBRD TIE 12 (SUTURE) ×1 IMPLANT
SUT SILK 2-0 30XBRD TIE 12 (SUTURE) IMPLANT
SUT SILK 3 0 (SUTURE) ×6
SUT SILK 3 0 SH CR/8 (SUTURE) ×3 IMPLANT
SUT SILK 3-0 18XBRD TIE 12 (SUTURE) ×2 IMPLANT
SUT VIC AB 4-0 SH 18 (SUTURE) IMPLANT
TRAY FOLEY W/METER SILVER 16FR (SET/KITS/TRAYS/PACK) ×3 IMPLANT
YANKAUER SUCT BULB TIP NO VENT (SUCTIONS) ×3 IMPLANT

## 2016-05-01 NOTE — H&P (Signed)
Kristy Murillo is an 61 y.o. female.   Chief Complaint: constipation, RLQ abdominal pain, nausea and vomiting HPI:  Pt presents to the Ed with onset of pain, and vomiting that started yesterday after brunch.  She has had ongoing pain and vomiting since that time, she cannot keep anything down.   She has been traveling and has some constipation with that normally, but she normally has daily BM's.  Because of the ongoing vomiting and pain she came to the ED this AM.  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 Gas-filled appendix extends posterior to the cecum, there is a small appendicolith along the tip. Total distended segment about 25.0 by 16.8 cm on the scanogram. The colon just distal to the cecal volvulus is twisted and nondistended.  We had Dr. Luvenia Starch from GI review CT and he does not think endoscopic decompression is indicated. We are ask to see.   Past Medical History:  Diagnosis Date  . Anxiety   . Endometriosis     Past Surgical History:  Procedure Laterality Date  . ABDOMINAL HYSTERECTOMY    . TONSILLECTOMY      No family history on file. Social History:  reports that she has never smoked. She has never used smokeless tobacco. She reports that she drinks about 0.6 oz of alcohol per week . Her drug history is not on file. ETOH  Social Tobacco:  None Drugs:  None Married/ Financial trader. Allergies:  Allergies  Allergen Reactions  . Codeine Nausea And Vomiting     Prior to Admission medications   Medication Sig Start Date End Date Taking? Authorizing Provider  carboxymethylcellulose (REFRESH PLUS) 0.5 % SOLN Place 1 drop into both eyes daily.   Yes Historical Provider, MD  cholecalciferol (VITAMIN D) 1000 units tablet Take 1,000 Units by mouth daily.   Yes Historical Provider, MD  ibuprofen  (ADVIL,MOTRIN) 200 MG tablet Take 200-800 mg by mouth every 6 (six) hours as needed (pain).   Yes Historical Provider, MD  Magnesium 100 MG TABS Take 100-200 mg by mouth at bedtime as needed (insomnia).   Yes Historical Provider, MD  MELATONIN PO Take 2 tablets by mouth at bedtime as needed (insomnia).   Yes Historical Provider, MD  OVER THE COUNTER MEDICATION Take 1 tablet by mouth daily. Iodine   Yes Historical Provider, MD  sertraline (ZOLOFT) 50 MG tablet Take 50 mg by mouth daily.   Yes Historical Provider, MD  zolpidem (AMBIEN) 5 MG tablet Take 5 mg by mouth at bedtime as needed for sleep.   Yes Historical Provider, MD     Results for orders placed or performed during the hospital encounter of 05/01/16 (from the past 48 hour(s))  Comprehensive metabolic panel     Status: Abnormal   Collection Time: 05/01/16  8:50 AM  Result Value Ref Range   Sodium 134 (L) 135 - 145 mmol/L   Potassium 3.3 (L) 3.5 - 5.1 mmol/L   Chloride 99 (L) 101 - 111 mmol/L   CO2 24 22 - 32 mmol/L   Glucose, Bld 130 (H) 65 - 99 mg/dL   BUN 16 6 - 20 mg/dL   Creatinine, Ser 0.76 0.44 - 1.00 mg/dL   Calcium 9.7 8.9 - 10.3 mg/dL   Total Protein 8.1 6.5 - 8.1 g/dL  Albumin 4.9 3.5 - 5.0 g/dL   AST 30 15 - 41 U/L   ALT 21 14 - 54 U/L   Alkaline Phosphatase 52 38 - 126 U/L   Total Bilirubin 1.0 0.3 - 1.2 mg/dL   GFR calc non Af Amer >60 >60 mL/min   GFR calc Af Amer >60 >60 mL/min    Comment: (NOTE) The eGFR has been calculated using the CKD EPI equation. This calculation has not been validated in all clinical situations. eGFR's persistently <60 mL/min signify possible Chronic Kidney Disease.    Anion gap 11 5 - 15  Lipase, blood     Status: None   Collection Time: 05/01/16  8:50 AM  Result Value Ref Range   Lipase 40 11 - 51 U/L  CBC with Differential     Status: Abnormal   Collection Time: 05/01/16  8:50 AM  Result Value Ref Range   WBC 11.9 (H) 4.0 - 10.5 K/uL   RBC 4.68 3.87 - 5.11 MIL/uL    Hemoglobin 14.3 12.0 - 15.0 g/dL   HCT 41.5 36.0 - 46.0 %   MCV 88.7 78.0 - 100.0 fL   MCH 30.6 26.0 - 34.0 pg   MCHC 34.5 30.0 - 36.0 g/dL   RDW 13.4 11.5 - 15.5 %   Platelets 279 150 - 400 K/uL   Neutrophils Relative % 77 %   Neutro Abs 9.2 (H) 1.7 - 7.7 K/uL   Lymphocytes Relative 17 %   Lymphs Abs 2.0 0.7 - 4.0 K/uL   Monocytes Relative 6 %   Monocytes Absolute 0.7 0.1 - 1.0 K/uL   Eosinophils Relative 0 %   Eosinophils Absolute 0.1 0.0 - 0.7 K/uL   Basophils Relative 0 %   Basophils Absolute 0.0 0.0 - 0.1 K/uL   Ct Abdomen Pelvis W Contrast  Result Date: 05/01/2016 CLINICAL DATA:  Abdominal pain with nausea and vomiting. EXAM: CT ABDOMEN AND PELVIS WITH CONTRAST TECHNIQUE: Multidetector CT imaging of the abdomen and pelvis was performed using the standard protocol following bolus administration of intravenous contrast. CONTRAST:  112m ISOVUE-300 IOPAMIDOL (ISOVUE-300) INJECTION 61% COMPARISON:  Abdominal ultrasound from 07/17/2008 FINDINGS: Lower chest: Mild reticulonodular opacity in the right lower lobe on image 6/4 likely from mild atypical infectious bronchiolitis. Hepatobiliary: Unremarkable Pancreas: Unremarkable Spleen: Unremarkable Adrenals/Urinary Tract: 6 mm hypodense lesion in the right kidney upper pole, technically nonspecific although statistically likely to be a cyst. Otherwise unremarkable. 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 Gas-filled appendix extends posterior to the cecum, there is a small appendicolith along the tip. Total distended segment about 25.0 by 16.8 cm on the scanogram. The colon just distal to the cecal volvulus is twisted and nondistended. Vascular/Lymphatic: No adenopathy. No significant degree of atherosclerosis. Reproductive: Uterus absent.  Adnexa unremarkable. Other: Trace free pelvic fluid. Slight stranding along the right paracolic gutter and omentum.  Musculoskeletal: Lumbar spondylosis and degenerative disc disease with loss of disc height most notable at L2-3 and L5-S1, and with impingement at all lumbar levels between L2 and S1. IMPRESSION: 1. Cecal volvulus with obstruction. Trace free pelvic fluid minimal fluid stranding along the right paracolic gutter and omentum. 2. Faint reticulonodular opacity in the right lower lobe compatible with mild atypical infectious bronchiolitis. 3. Lumbar spondylosis and degenerative disc disease, contributing to impingement at all levels between L2 and S1. These results were called by telephone at the time of interpretation on 05/01/2016 at 11:18 am  to Dr. Sherwood Gambler , who verbally acknowledged these results. Electronically Signed   By: Van Clines M.D.   On: 05/01/2016 11:22   Dg Chest Portable 1 View  Result Date: 05/01/2016 CLINICAL DATA:  Abdominal pain, nausea and vomiting, constipation an enema yesterday, awoke this morning with mid to RIGHT lower quadrant pain and RIGHT back pain EXAM: PORTABLE CHEST 1 VIEW COMPARISON:  Portable exam 0831 hours compared to 11/06/2003 FINDINGS: Normal heart size, mediastinal contours, and pulmonary vascularity. Lungs clear. No pleural effusion or pneumothorax. Bones unremarkable. IMPRESSION: No acute abnormalities. Electronically Signed   By: Lavonia Dana M.D.   On: 05/01/2016 08:58    Review of Systems  Constitutional: Negative.   HENT: Negative.   Eyes: Negative.   Respiratory: Negative.   Cardiovascular: Negative.   Gastrointestinal: Positive for abdominal pain, constipation and vomiting.  Genitourinary: Negative.        Decreased urine output today  Musculoskeletal: Negative.   Skin: Negative.   Neurological: Negative.   Endo/Heme/Allergies: Negative.   Psychiatric/Behavioral: Negative.     Blood pressure 132/95, pulse 94, temperature 98.2 F (36.8 C), temperature source Oral, resp. rate 20, height '5\' 9"'  (1.753 m), weight 65.8 kg (145 lb), SpO2 97  %. Physical Exam  Constitutional: She appears well-developed and well-nourished. No distress.  Thin female in no acute distress  HENT:  Head: Normocephalic and atraumatic.  Mouth/Throat: No oropharyngeal exudate.  Eyes: Right eye exhibits no discharge. Left eye exhibits no discharge. No scleral icterus.  Neck: Normal range of motion. Neck supple. No JVD present. No tracheal deviation present. No thyromegaly present.  Cardiovascular: Normal rate, regular rhythm, normal heart sounds and intact distal pulses.   No murmur heard. Respiratory: Effort normal and breath sounds normal. No respiratory distress. She has no wheezes. She has no rales. She exhibits no tenderness.  GI: Soft. She exhibits distension. She exhibits no mass. There is no tenderness. There is no rebound and no guarding.  Lymphadenopathy:    She has no cervical adenopathy.  Skin: She is not diaphoretic.     Assessment/Plan Cecal volvulus with obstruction  Plan:  Surgery this afternoon  Nelson Julson, PA-C 05/01/2016, 11:50 AM

## 2016-05-01 NOTE — ED Notes (Signed)
Patient ambulated to restroom. Patient unable to void at this time

## 2016-05-01 NOTE — ED Provider Notes (Signed)
[facility]-EMERGENCY DEPT Provider Note   CSN: [id number] Arrival date & time: 05/01/16  0818     History   Chief Complaint Chief Complaint  Patient presents with  . Nausea  . Abdominal Pain    HPI Kristy Murillo is a 61 y.o. female.  HPI  61 year old female with abdominal pain since yesterday. Started in RLQ about 1 hour after breakfast. Vomited everything she tried to eat or drink yesterday. Is RLQ and right lower back. No fevers. Pain is severe and sharp. No dysuria, hematuria. Took her husband's tramadol this AM without any relief. Has been constipated for about 1 week with harder stools but still going. Used an enema yesterday with large BM. Tried again this AM with minimal output. Did not feel better after either. Pain started intermittently with bloating up and down but now is constant this AM.  Past Medical History:  Diagnosis Date  . Anxiety   . Endometriosis     Patient Active Problem List   Diagnosis Date Noted  . 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 ABDOMINAL HYSTERECTOMY    . TONSILLECTOMY      OB History    No data available       Home Medications    Prior to Admission medications   Medication Sig Start Date End Date Taking? Authorizing Provider  carboxymethylcellulose (REFRESH PLUS) 0.5 % SOLN Place 1 drop into both eyes daily.   Yes Historical Provider, MD  cholecalciferol (VITAMIN D) 1000 units tablet Take 1,000 Units by mouth daily.   Yes Historical Provider, MD  ibuprofen (ADVIL,MOTRIN) 200 MG tablet Take 200-800 mg by mouth every 6 (six) hours as needed (pain).   Yes Historical Provider, MD  Magnesium 100 MG TABS Take 100-200 mg by mouth at bedtime as needed (insomnia).   Yes Historical Provider, MD  MELATONIN PO Take 2 tablets by mouth at bedtime as needed (insomnia).   Yes Historical Provider, MD  OVER THE COUNTER MEDICATION Take 1 tablet by mouth daily. Iodine   Yes Historical Provider, MD    sertraline (ZOLOFT) 50 MG tablet Take 50 mg by mouth daily.   Yes Historical Provider, MD  zolpidem (AMBIEN) 5 MG tablet Take 5 mg by mouth at bedtime as needed for sleep.   Yes Historical Provider, MD    Family History No family history on file.  Social History Social History  Substance Use Topics  . Smoking status: Never Smoker  . Smokeless tobacco: Never Used  . Alcohol use 0.6 oz/week    1 Glasses of wine per week     Allergies   Codeine   Review of Systems Review of Systems  Constitutional: Negative for fever.  Gastrointestinal: Positive for abdominal pain, constipation, nausea and vomiting.  Genitourinary: Negative for dysuria and hematuria.  Musculoskeletal: Positive for back pain.  All other systems reviewed and are negative.    Physical Exam Updated Vital Signs BP 117/66   Pulse 83   Temp 99.4 F (37.4 C)   Resp 16   Ht 5\' 9"  (1.753 m)   Wt 145 lb (65.8 kg)   SpO2 100%   BMI 21.41 kg/m   Physical Exam  Constitutional: She is oriented to person, place, and time. She appears well-developed and well-nourished. She appears distressed.  HENT:  Head: Normocephalic and atraumatic.  Right Ear: External ear normal.  Left Ear: External ear normal.  Nose: Nose normal.  Eyes: Right eye  exhibits no discharge. Left eye exhibits no discharge.  Cardiovascular: Normal rate, regular rhythm and normal heart sounds.   Pulmonary/Chest: Effort normal and breath sounds normal.  Abdominal: Soft. She exhibits distension (lower). There is tenderness. There is guarding. There is no CVA tenderness.  Neurological: She is alert and oriented to person, place, and time.  Skin: Skin is warm and dry. She is not diaphoretic.  Nursing note and vitals reviewed.    ED Treatments / Results  Labs (all labs ordered are listed, but only abnormal results are displayed) Labs Reviewed  COMPREHENSIVE METABOLIC PANEL - Abnormal; Notable for the following:       Result Value   Sodium  134 (*)    Potassium 3.3 (*)    Chloride 99 (*)    Glucose, Bld 130 (*)    All other components within normal limits  CBC WITH DIFFERENTIAL/PLATELET - Abnormal; Notable for the following:    WBC 11.9 (*)    Neutro Abs 9.2 (*)    All other components within normal limits  LIPASE, BLOOD  CBC  CREATININE, SERUM  SURGICAL PATHOLOGY    EKG  EKG Interpretation None       Radiology Ct Abdomen Pelvis W Contrast  Result Date: 05/01/2016 CLINICAL DATA:  Abdominal pain with nausea and vomiting. EXAM: CT ABDOMEN AND PELVIS WITH CONTRAST TECHNIQUE: Multidetector CT imaging of the abdomen and pelvis was performed using the standard protocol following bolus administration of intravenous contrast. CONTRAST:  ISOVUE-300 IOPAMIDOL (ISOVUE-300) INJECTION 61% COMPARISON:  Abdominal ultrasound from 07/17/2008 FINDINGS: Lower chest: Mild reticulonodular opacity in the right lower lobe on image 6/4 likely from mild atypical infectious bronchiolitis. Hepatobiliary: Unremarkable Pancreas: Unremarkable Spleen: Unremarkable Adrenals/Urinary Tract: 6 mm hypodense lesion in the right kidney upper pole, technically nonspecific although statistically likely to be a cyst. Otherwise unremarkable. 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 Gas-filled appendix extends posterior to the cecum, there is a small appendicolith along the tip. Total distended segment about 25.0 by 16.8 cm on the scanogram. The colon just distal to the cecal volvulus is twisted and nondistended. Vascular/Lymphatic: No adenopathy. No significant degree of atherosclerosis. Reproductive: Uterus absent.  Adnexa unremarkable. Other: Trace free pelvic fluid. Slight stranding along the right paracolic gutter and omentum. Musculoskeletal: Lumbar spondylosis and degenerative disc disease with loss of disc height most notable at L2-3 and L5-S1, and with impingement at all  lumbar levels between L2 and S1. IMPRESSION: 1. Cecal volvulus with obstruction. Trace free pelvic fluid minimal fluid stranding along the right paracolic gutter and omentum. 2. Faint reticulonodular opacity in the right lower lobe compatible with mild atypical infectious bronchiolitis. 3. Lumbar spondylosis and degenerative disc disease, contributing to impingement at all levels between L2 and S1. These results were called by telephone at the time of interpretation on 05/01/2016 at 11:18 am to Dr. Pricilla Loveless , who verbally acknowledged these results. Electronically Signed   By: Gaylyn Rong M.D.   On: 05/01/2016 11:22   Dg Chest Portable 1 View  Result Date: 05/01/2016 CLINICAL DATA:  Abdominal pain, nausea and vomiting, constipation an enema yesterday, awoke this morning with mid to RIGHT lower quadrant pain and RIGHT back pain EXAM: PORTABLE CHEST 1 VIEW COMPARISON:  Portable exam 0831 hours compared to 11/06/2003 FINDINGS: Normal heart size, mediastinal contours, and pulmonary vascularity. Lungs clear. No pleural effusion or pneumothorax. Bones unremarkable. IMPRESSION: No acute abnormalities. Electronically Signed   By:  Ulyses Southward M.D.   On: 05/01/2016 08:58    Procedures Procedures (including critical care time)  Medications Ordered in ED Medications  iopamidol (ISOVUE-300) 61 % injection (not administered)  naloxone (NARCAN) injection 0.4 mg (not administered)    And  sodium chloride flush (NS) 0.9 % injection 9 mL (not administered)  ondansetron (ZOFRAN) injection 4 mg (not administered)  diphenhydrAMINE (BENADRYL) injection 12.5 mg (not administered)    Or  diphenhydrAMINE (BENADRYL) 12.5 MG/5ML elixir 12.5 mg (not administered)  dextrose 5 % and 0.45 % NaCl with KCl 20 mEq/L infusion (1,000 mLs Intravenous New Bag/Given 05/01/16 1538)  morphine (MORPHINE) 2 mg/mL PCA injection ( Intravenous Set-up / Initial Syringe 05/01/16 1641)  enoxaparin (LOVENOX) injection 40 mg (not  administered)  ondansetron (ZOFRAN-ODT) disintegrating tablet 4 mg (not administered)    Or  ondansetron (ZOFRAN) injection 4 mg (not administered)  acetaminophen (OFIRMEV) IV 1,000 mg (not administered)  fentaNYL (SUBLIMAZE) injection 100 mcg (100 mcg Intravenous Given 05/01/16 0853)  ondansetron (ZOFRAN) injection 4 mg (4 mg Intravenous Given 05/01/16 0853)  sodium chloride 0.9 % bolus 1,000 mL (0 mLs Intravenous Stopped 05/01/16 1009)  iopamidol (ISOVUE-300) 61 % injection 30 mL (30 mLs Oral Contrast Given 05/01/16 0912)  iopamidol (ISOVUE-300) 61 % injection 100 mL (100 mLs Intravenous Contrast Given 05/01/16 1052)  ondansetron (ZOFRAN) injection 4 mg (4 mg Intravenous Given 05/01/16 1009)  fentaNYL (SUBLIMAZE) injection 50 mcg (50 mcg Intravenous Given 05/01/16 1155)  ketorolac (TORADOL) 30 MG/ML injection 30 mg (30 mg Intravenous Given 05/01/16 1534)  acetaminophen (OFIRMEV) IV 1,000 mg (1,000 mg Intravenous Given 05/01/16 1537)     Initial Impression / Assessment and Plan / ED Course  I have reviewed the triage vital signs and the nursing notes.  Pertinent labs & imaging results that were available during my care of the patient were reviewed by me and considered in my medical decision making (see chart for details).  Clinical Course as of May 02 1711  Thu May 01, 2016  1610 Pain is currently diffuse. No peritonitis at this time. Will get CT, labs, give IV fentanyl, zofran. NPO  [SG]  1116 CT shows cecal volvulus. Will consult surgery.  [SG]  1144 Discussed with CCS, Will Marlyne Beards, he is talking to GI to see if they want/can decompress. If not will go to OR.  [SG]    Clinical Course User Index [SG] Pricilla Loveless, MD    Patient's pain has improved while in the ED. No vomiting. Abdominal distention has remained the same. She will be taken to the OR for laparotomy for her cecal volvulus.  Final Clinical Impressions(s) / ED Diagnoses   Final diagnoses:  Cecal volvulus [facility])    New  Prescriptions Current Discharge Medication List       Pricilla Loveless, MD 05/01/16 [phone]

## 2016-05-01 NOTE — Anesthesia Preprocedure Evaluation (Addendum)
Anesthesia Evaluation  Patient identified by MRN, date of birth, ID band Patient awake    Reviewed: Allergy & Precautions, NPO status , Patient's Chart, lab work & pertinent test results  History of Anesthesia Complications (+) PONV and history of anesthetic complications  Airway Mallampati: II  TM Distance: >3 FB Neck ROM: Full    Dental  (+) Teeth Intact, Dental Advisory Given, Caps,    Pulmonary neg pulmonary ROS,    Pulmonary exam normal breath sounds clear to auscultation       Cardiovascular Exercise Tolerance: Good negative cardio ROS Normal cardiovascular exam Rhythm:Regular Rate:Normal     Neuro/Psych PSYCHIATRIC DISORDERS Anxiety negative neurological ROS     GI/Hepatic Neg liver ROS, Cecal volvulus   Endo/Other  negative endocrine ROS  Renal/GU negative Renal ROS     Musculoskeletal negative musculoskeletal ROS (+)   Abdominal   Peds  Hematology negative hematology ROS (+)   Anesthesia Other Findings Day of surgery medications reviewed with the patient.  Reproductive/Obstetrics Endometriosis                            Anesthesia Physical Anesthesia Plan  ASA: II and emergent  Anesthesia Plan: General   Post-op Pain Management:    Induction: Intravenous, Rapid sequence and Cricoid pressure planned  Airway Management Planned: Oral ETT  Additional Equipment:   Intra-op Plan:   Post-operative Plan: Extubation in OR and Possible Post-op intubation/ventilation  Informed Consent: I have reviewed the patients History and Physical, chart, labs and discussed the procedure including the risks, benefits and alternatives for the proposed anesthesia with the patient or authorized representative who has indicated his/her understanding and acceptance.   Dental advisory given  Plan Discussed with: CRNA  Anesthesia Plan Comments: (Risks/benefits of general anesthesia discussed  with patient including risk of damage to teeth, lips, gum, and tongue, nausea/vomiting, allergic reactions to medications, and the possibility of heart attack, stroke and death.  All patient questions answered.  Patient wishes to proceed.)        Anesthesia Quick Evaluation

## 2016-05-01 NOTE — Transfer of Care (Signed)
Immediate Anesthesia Transfer of Care Note  Patient: Kristy Murillo  Procedure(s) Performed: Procedure(s): EXPLORATORY LAPAROTOMY WITH PARTIAL COLECTOMY (N/A)  Patient Location: PACU  Anesthesia Type:General  Level of Consciousness: awake, oriented, patient cooperative, lethargic and responds to stimulation  Airway & Oxygen Therapy: Patient Spontanous Breathing and Patient connected to face mask oxygen  Post-op Assessment: Report given to RN, Post -op Vital signs reviewed and stable and Patient moving all extremities  Post vital signs: Reviewed and stable  Last Vitals:  Vitals:   05/01/16 0904 05/01/16 1159  BP:  120/71  Pulse:  81  Resp:  19  Temp: 36.8 C     Last Pain:  Vitals:   05/01/16 1159  TempSrc:   PainSc: 8          Complications: No apparent anesthesia complications

## 2016-05-01 NOTE — Brief Op Note (Signed)
05/01/2016  2:46 PM  PATIENT:  Kristy Murillo  61 y.o. female  PRE-OPERATIVE DIAGNOSIS:  cecal volvulus  POST-OPERATIVE DIAGNOSIS:  cecal volvulus  PROCEDURE:  Procedure(s): EXPLORATORY LAPAROTOMY WITH PARTIAL COLECTOMY (N/A)  SURGEON:  Surgeon(s) and Role:    * Darnell Levelodd Taiyana Kissler, MD - Primary  ASSISTANTS: Traci Pozil, RN   ANESTHESIA:   general  EBL:  Total I/O In: 2000 [I.V.:1000; IV Piggyback:1000] Out: 250 [Urine:200; Blood:50]  BLOOD ADMINISTERED:none  DRAINS: none   LOCAL MEDICATIONS USED:  NONE  SPECIMEN:  Excision  DISPOSITION OF SPECIMEN:  PATHOLOGY  COUNTS:  YES  TOURNIQUET:  * No tourniquets in log *  DICTATION: .Other Dictation: Dictation Number [id number]  PLAN OF CARE: Admit to inpatient   PATIENT DISPOSITION:  PACU - hemodynamically stable.   Delay start of Pharmacological VTE agent (>24hrs) due to surgical blood loss or risk of bleeding: yes  Velora Hecklerodd M. Anetha Slagel, MD, [facility] Office: [phone]

## 2016-05-01 NOTE — ED Notes (Signed)
Will surgical PA at bedside 

## 2016-05-01 NOTE — ED Triage Notes (Signed)
Patient c/o abd pain with nausea and vomiting. patietn had constipation and did enema yesterday.  Patient woke up this morning with mid to RLQ pain and right back pain.  Patient tried drinking juice and popsickle and vomited.

## 2016-05-01 NOTE — Anesthesia Procedure Notes (Signed)
Procedure Name: Intubation Date/Time: 05/01/2016 1:10 PM Performed by: Lind Covert Pre-anesthesia Checklist: Patient identified, Timeout performed, Emergency Drugs available, Suction available and Patient being monitored Patient Re-evaluated:Patient Re-evaluated prior to inductionOxygen Delivery Method: Circle system utilized Preoxygenation: Pre-oxygenation with 100% oxygen Intubation Type: IV induction Laryngoscope Size: Mac and 3 Grade View: Grade I Tube type: Oral Tube size: 7.0 mm Number of attempts: 1 Airway Equipment and Method: Stylet Placement Confirmation: ETT inserted through vocal cords under direct vision,  positive ETCO2 and breath sounds checked- equal and bilateral Secured at: 22 cm Tube secured with: Tape Dental Injury: Teeth and Oropharynx as per pre-operative assessment

## 2016-05-02 ENCOUNTER — Encounter (HOSPITAL_COMMUNITY): Payer: Self-pay | Admitting: Surgery

## 2016-05-02 MED ORDER — ENOXAPARIN SODIUM 40 MG/0.4ML ~~LOC~~ SOLN
40.0000 mg | SUBCUTANEOUS | Status: DC
  Administered 2016-05-02 – 2016-05-03 (×2): 40 mg via SUBCUTANEOUS
  Filled 2016-05-02 (×2): qty 0.4

## 2016-05-02 MED ORDER — POLYVINYL ALCOHOL 1.4 % OP SOLN
1.0000 [drp] | Freq: Every day | OPHTHALMIC | Status: DC
  Administered 2016-05-02 – 2016-05-06 (×5): 1 [drp] via OPHTHALMIC
  Filled 2016-05-02: qty 15

## 2016-05-02 MED ORDER — CARBOXYMETHYLCELLULOSE SODIUM 0.5 % OP SOLN: 1.0000 [drp] | Freq: Every day | OPHTHALMIC | Status: DC

## 2016-05-02 NOTE — Progress Notes (Signed)
1 Day Post-Op  Subjective: She is doing well this AM.  We worked on IS, she has a few BS.  Incision looks fine.  Objective: Vital signs in last 24 hours: Temp:  [98.2 F (36.8 C)-100 F (37.8 C)] 98.8 F (37.1 C) (01/19 0600) Pulse Rate:  [64-94] 66 (01/19 0600) Resp:  [8-20] 20 (01/19 0600) BP: (107-132)/(56-95) 108/57 (01/19 0600) SpO2:  [97 %-100 %] 100 % (01/19 0600) Weight:  [65.8 kg (145 lb)] 65.8 kg (145 lb) (01/18 0825) Last BM Date: 04/30/16 60 PO 3400 iv Urine 1550 TM 100, VSS No labs NO films Intake/Output from previous day: 01/18 0701 - 01/19 0700 In: 4685 [P.O.:60; I.V.:3425; IV Piggyback:1200] Out: 1600 [Urine:1550; Blood:50] Intake/Output this shift: No intake/output data recorded.  General appearance: alert, cooperative and no distress Resp: clear to auscultation bilaterally GI: soft, few BS, incision looks fine  Lab Results:   Recent Labs  05/01/16 0850  WBC 11.9*  HGB 14.3  HCT 41.5  PLT 279    BMET  Recent Labs  05/01/16 0850  NA 134*  K 3.3*  CL 99*  CO2 24  GLUCOSE 130*  BUN 16  CREATININE 0.76  CALCIUM 9.7   PT/INR No results for input(s): LABPROT, INR in the last 72 hours.   Recent Labs Lab 05/01/16 0850  AST 30  ALT 21  ALKPHOS 52  BILITOT 1.0  PROT 8.1  ALBUMIN 4.9     Lipase     Component Value Date/Time   LIPASE 40 05/01/2016 0850     Studies/Results: Ct Abdomen Pelvis W Contrast  Result Date: 05/01/2016 CLINICAL DATA:  Abdominal pain with nausea and vomiting. EXAM: CT ABDOMEN AND PELVIS WITH CONTRAST TECHNIQUE: Multidetector CT imaging of the abdomen and pelvis was performed using the standard protocol following bolus administration of intravenous contrast. CONTRAST:  100mL ISOVUE-300 IOPAMIDOL (ISOVUE-300) INJECTION 61% COMPARISON:  Abdominal ultrasound from 07/17/2008 FINDINGS: Lower chest: Mild reticulonodular opacity in the right lower lobe on image 6/4 likely from mild atypical infectious bronchiolitis.  Hepatobiliary: Unremarkable Pancreas: Unremarkable Spleen: Unremarkable Adrenals/Urinary Tract: 6 mm hypodense lesion in the right kidney upper pole, technically nonspecific although statistically likely to be a cyst. Otherwise unremarkable. 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 Gas-filled appendix extends posterior to the cecum, there is a small appendicolith along the tip. Total distended segment about 25.0 by 16.8 cm on the scanogram. The colon just distal to the cecal volvulus is twisted and nondistended. Vascular/Lymphatic: No adenopathy. No significant degree of atherosclerosis. Reproductive: Uterus absent.  Adnexa unremarkable. Other: Trace free pelvic fluid. Slight stranding along the right paracolic gutter and omentum. Musculoskeletal: Lumbar spondylosis and degenerative disc disease with loss of disc height most notable at L2-3 and L5-S1, and with impingement at all lumbar levels between L2 and S1. IMPRESSION: 1. Cecal volvulus with obstruction. Trace free pelvic fluid minimal fluid stranding along the right paracolic gutter and omentum. 2. Faint reticulonodular opacity in the right lower lobe compatible with mild atypical infectious bronchiolitis. 3. Lumbar spondylosis and degenerative disc disease, contributing to impingement at all levels between L2 and S1. These results were called by telephone at the time of interpretation on 05/01/2016 at 11:18 am to Dr. Pricilla LovelessSCOTT GOLDSTON , who verbally acknowledged these results. Electronically Signed   By: Gaylyn RongWalter  Liebkemann M.D.   On: 05/01/2016 11:22   Dg Chest Portable 1 View  Result Date: 05/01/2016 CLINICAL DATA:  Abdominal pain,  nausea and vomiting, constipation an enema yesterday, awoke this morning with mid to RIGHT lower quadrant pain and RIGHT back pain EXAM: PORTABLE CHEST 1 VIEW COMPARISON:  Portable exam 0831 hours compared to 11/06/2003 FINDINGS: Normal heart  size, mediastinal contours, and pulmonary vascularity. Lungs clear. No pleural effusion or pneumothorax. Bones unremarkable. IMPRESSION: No acute abnormalities. Electronically Signed   By: Ulyses Southward M.D.   On: 05/01/2016 08:58    Medications: . acetaminophen  1,000 mg Intravenous Q6H  . morphine   Intravenous Q4H   . dextrose 5 % and 0.45 % NaCl with KCl 20 mEq/L 1,000 mL (05/01/16 1538)    Assessment/Plan Acute cecal volvulus. S/p open right colectomy, 05/01/16, Dr. Darnell Level FEN:  IV fluids/NPO - ice chips ID:  Pre op Cefotetan DVT:  SCD - starting Lovenox this PM 2200  Plan:  Recheck labs in AM.  OOB to chair, she is on ice chips, start to mobilize today.          LOS: 1 day    Kristy Murillo 05/02/2016 [phone]

## 2016-05-02 NOTE — Op Note (Signed)
NAME:  Kristy Murillo, Kristy Murillo NO.:  [id number]  MEDICAL RECORD NO.:  [id number]  LOCATION:  [facility]                         FACILITY:  [facility]  PHYSICIAN:  Velora Heckler, MD      DATE OF BIRTH:  1956/02/16  DATE OF PROCEDURE:  05/01/2016                              OPERATIVE REPORT   PREOPERATIVE DIAGNOSIS:  Acute cecal volvulus.  POSTOPERATIVE DIAGNOSIS:  Acute cecal volvulus.  PROCEDURE:  Open right colectomy.  SURGEON:  Velora Heckler, MD, FACS   ANESTHESIA:  General.  ESTIMATED BLOOD LOSS:  Minimal.  PREPARATION:  ChloraPrep.  COMPLICATIONS:  None.  INDICATIONS:  The patient is a 61 year old white female, who presented to the emergency department with 24-hour history of abdominal pain, nausea and vomiting.  Evaluation included a CT scan of the abdomen, which demonstrated evidence of a cecal volvulus.  Cecum was markedly distended at 25 x 16.8 cm.  The patient was prepared urgently and brought to the operating room for management.  BODY OF REPORT:  Procedure was done in OR #2 at the [facility].  The patient was brought to the operating room and placed in supine position on the operating room table.  Following administration of general anesthesia, the patient was positioned and then prepped and draped in the usual aseptic fashion.  After ascertaining that an adequate level of anesthesia had been achieved, a midline abdominal incision was made with a #10 blade.  Dissection was carried through the subcutaneous tissues.  Fascia was carefully incised and the peritoneal cavity was carefully entered.  Fascial incision was extended cephalad and caudad.  There is a markedly distended cecum in the left upper quadrant of the abdomen.  Exploration reveals a fixed point in the mid ascending colon where the right colon has volvulized. This point, is incised with the electrocautery and use of the EnSeal. This allows for mobilization and reduction of the  volvulus.  Peritoneal reflection along the right colon is incised, allowing for mobilization and delivery of the entire right colon up and through the midline incision.  Peritoneal attachments are divided.  Hepatic flexure is taken down allowing for complete mobilization of the right colon.  A point at the distal ileum is selected and transected with the GIA stapler.  Appendix is present and appears grossly normal.  Mesoappendix is divided with the EnSeal.  Colonic mesentery is divided with the EnSeal.  Right colic artery is identified and divided between clamps with the EnSeal and then ligated with a 2-0 silk ties.  A point in the proximal transverse colon is selected.  It is transected with the GIA stapler.  The remaining right colonic mesentery is then taken down with the EnSeal taking care to avoid retroperitoneal structures.  The entire colon on the right side is excised and submitted to Pathology for review.  Next, a side-to-side functionally end-to-end anastomosis was created between the terminal ileum and the proximal transverse colon.  This was performed in the usual fashion with the GIA stapler.  Staple line was inspected for hemostasis.  Enterotomy was closed with a TA-60 stapler. Mesenteric defect was closed with interrupted 2-0 silk sutures.  Abdomen was irrigated with warm saline, which was evacuated.  At this point, gowns and gloves were changed and new drapes were applied.  The abdomen was again inspected and irrigation fluid evacuated.  Good hemostasis was noted.  Midline incision was closed with running #1 PDS suture.  Subcutaneous tissues were irrigated.  Skin was closed with stainless steel staples.  Sterile dressings were applied. The patient was awakened from anesthesia and taken to the recovery room. The patient tolerated the procedure well.   Velora Hecklerodd M. Shekelia Boutin, MD, [facility] Office: [phone]    TMG/MEDQ  D:  05/01/2016  T:   05/02/2016  Job:  [id number]

## 2016-05-03 LAB — CBC
HCT: 21.6 % — ABNORMAL LOW (ref 36.0–46.0)
HEMATOCRIT: 21.8 % — AB (ref 36.0–46.0)
HEMOGLOBIN: 7.4 g/dL — AB (ref 12.0–15.0)
Hemoglobin: 7.2 g/dL — ABNORMAL LOW (ref 12.0–15.0)
MCH: 31 pg (ref 26.0–34.0)
MCH: 30.6 pg (ref 26.0–34.0)
MCHC: 33.9 g/dL (ref 30.0–36.0)
MCHC: 33.3 g/dL (ref 30.0–36.0)
MCV: 91.2 fL (ref 78.0–100.0)
MCV: 91.9 fL (ref 78.0–100.0)
PLATELETS: 164 10*3/uL (ref 150–400)
Platelets: 188 10*3/uL (ref 150–400)
RBC: 2.39 MIL/uL — ABNORMAL LOW (ref 3.87–5.11)
RBC: 2.35 MIL/uL — AB (ref 3.87–5.11)
RDW: 13.9 % (ref 11.5–15.5)
RDW: 13.7 % (ref 11.5–15.5)
WBC: 8.2 10*3/uL (ref 4.0–10.5)
WBC: 8.3 10*3/uL (ref 4.0–10.5)

## 2016-05-03 LAB — BASIC METABOLIC PANEL
Anion gap: 4 — ABNORMAL LOW (ref 5–15)
BUN: 8 mg/dL (ref 6–20)
CALCIUM: 7.9 mg/dL — AB (ref 8.9–10.3)
CHLORIDE: 103 mmol/L (ref 101–111)
CO2: 27 mmol/L (ref 22–32)
CREATININE: 0.64 mg/dL (ref 0.44–1.00)
GFR calc non Af Amer: >60 mL/min (ref >60)
GLUCOSE: 119 mg/dL — AB (ref 65–99)
Potassium: 4.5 mmol/L (ref 3.5–5.1)
Sodium: 134 mmol/L — ABNORMAL LOW (ref 135–145)

## 2016-05-03 LAB — ABO/RH: ABO/RH(D): O NEG

## 2016-05-03 MED ORDER — DEXTROSE 5 % IV SOLN
500.0000 mg | Freq: Three times a day (TID) | INTRAVENOUS | Status: DC | PRN
  Administered 2016-05-04: 500 mg via INTRAVENOUS
  Filled 2016-05-03 (×3): qty 5
  Filled 2016-05-03: qty 550

## 2016-05-03 MED ORDER — ACETAMINOPHEN 10 MG/ML IV SOLN
1000.0000 mg | Freq: Four times a day (QID) | INTRAVENOUS | Status: AC
  Administered 2016-05-03 – 2016-05-04 (×4): 1000 mg via INTRAVENOUS
  Filled 2016-05-03 (×5): qty 100

## 2016-05-03 MED ORDER — KETOROLAC TROMETHAMINE 15 MG/ML IJ SOLN
INTRAMUSCULAR | Status: AC
  Administered 2016-05-03: 15 mg
  Filled 2016-05-03: qty 1

## 2016-05-03 MED ORDER — KETOROLAC TROMETHAMINE 15 MG/ML IJ SOLN: 15.0000 mg | Freq: Once | INTRAMUSCULAR | Status: DC

## 2016-05-03 NOTE — Progress Notes (Signed)
[facility] Progress Note  2 Days Post-Op  Subjective: Patient is POD2 from right hemicolectomy. Reports feeling "little better than yesterday." States she is still using her PCA and it is controlling her pain level to 7/10. Reports she has been burping but has not passed gas or had BM. States she has been up 3 times since surgery and has been using IS. Denies current N/V, SOB, fever, and chills.    Objective: Vital signs in last 24 hours: Temp:  [98.4 F (36.9 C)-99.8 F (37.7 C)] 98.4 F (36.9 C) (01/20 0532) Pulse Rate:  [65-74] 69 (01/20 0532) Resp:  [14-20] 16 (01/20 0751) BP: (101-110)/(52-57) 101/52 (01/20 0532) SpO2:  [97 %-100 %] 100 % (01/20 0751) Last BM Date: (P) 04/30/16  Intake/Output from previous day: 01/19 0701 - 01/20 0700 In: 2650 [P.O.:150; I.V.:2500] Out: 2820 [Urine:2820] Intake/Output this shift: No intake/output data recorded.  PE: Gen:  Alert, NAD, pleasant Card:  RRR, no M/G/R heard Pulm:  CTAB, no W/R/R, unlabored breathing Abd: Soft, appropriate tenderness around incision, ND, +BS, incision C/D/I with honeycomb dressing Ext:  No erythema, edema, or tenderness   Lab Results:   Recent Labs  05/01/16 0850 05/03/16 0448  WBC 11.9* 8.2  HGB 14.3 7.4*  HCT 41.5 21.8*  PLT 279 188   BMET  Recent Labs  05/01/16 0850 05/03/16 0448  NA 134* 134*  K 3.3* 4.5  CL 99* 103  CO2 24 27  GLUCOSE 130* 119*  BUN 16 8  CREATININE 0.76 0.64  CALCIUM 9.7 7.9*   PT/INR No results for input(s): LABPROT, INR in the last 72 hours. CMP     Component Value Date/Time   NA 134 (L) 05/03/2016 0448   K 4.5 05/03/2016 0448   CL 103 05/03/2016 0448   CO2 27 05/03/2016 0448   GLUCOSE 119 (H) 05/03/2016 0448   BUN 8 05/03/2016 0448   CREATININE 0.64 05/03/2016 0448   CALCIUM 7.9 (L) 05/03/2016 0448   PROT 8.1 05/01/2016 0850   ALBUMIN 4.9 05/01/2016 0850   AST 30 05/01/2016 0850   ALT 21 05/01/2016 0850   ALKPHOS 52 05/01/2016 0850    BILITOT 1.0 05/01/2016 0850   GFRNONAA >60 05/03/2016 0448   GFRAA >60 05/03/2016 0448   Lipase     Component Value Date/Time   LIPASE 40 05/01/2016 0850       Studies/Results: Ct Abdomen Pelvis W Contrast  Result Date: 05/01/2016 CLINICAL DATA:  Abdominal pain with nausea and vomiting. EXAM: CT ABDOMEN AND PELVIS WITH CONTRAST TECHNIQUE: Multidetector CT imaging of the abdomen and pelvis was performed using the standard protocol following bolus administration of intravenous contrast. CONTRAST:  100mL ISOVUE-300 IOPAMIDOL (ISOVUE-300) INJECTION 61% COMPARISON:  Abdominal ultrasound from 07/17/2008 FINDINGS: Lower chest: Mild reticulonodular opacity in the right lower lobe on image 6/4 likely from mild atypical infectious bronchiolitis. Hepatobiliary: Unremarkable Pancreas: Unremarkable Spleen: Unremarkable Adrenals/Urinary Tract: 6 mm hypodense lesion in the right kidney upper pole, technically nonspecific although statistically likely to be a cyst. Otherwise unremarkable. 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 Gas-filled appendix extends posterior to the cecum, there is a small appendicolith along the tip. Total distended segment about 25.0 by 16.8 cm on the scanogram. The colon just distal to the cecal volvulus is twisted and nondistended. Vascular/Lymphatic: No adenopathy. No significant degree of atherosclerosis. Reproductive: Uterus absent.  Adnexa unremarkable. Other: Trace free pelvic fluid. Slight stranding along the  right paracolic gutter and omentum. Musculoskeletal: Lumbar spondylosis and degenerative disc disease with loss of disc height most notable at L2-3 and L5-S1, and with impingement at all lumbar levels between L2 and S1. IMPRESSION: 1. Cecal volvulus with obstruction. Trace free pelvic fluid minimal fluid stranding along the right paracolic gutter and omentum. 2. Faint reticulonodular opacity  in the right lower lobe compatible with mild atypical infectious bronchiolitis. 3. Lumbar spondylosis and degenerative disc disease, contributing to impingement at all levels between L2 and S1. These results were called by telephone at the time of interpretation on 05/01/2016 at 11:18 am to Dr. Pricilla Loveless , who verbally acknowledged these results. Electronically Signed   By: Gaylyn Rong M.D.   On: 05/01/2016 11:22    Anti-infectives: Anti-infectives    Start     Dose/Rate Route Frequency Ordered Stop   05/01/16 1245  cefoTEtan (CEFOTAN) 2 g in dextrose 5 % 50 mL IVPB  Status:  Discontinued     2 g 100 mL/hr over 30 Minutes Intravenous Every 12 hours 05/01/16 1235 05/01/16 1711       Assessment/Plan  Acute cecal volvulus. - S/p open right colectomy, 05/01/16, Dr. Darnell Level  FEN:  IV fluids/NPO - ice chips ID:  Pre op Cefotetan DVT:  SCD, lovenox  Plan: Continue to encourage pulmonary toilet. Will repeat labs to assess hgb/hct 7.4/21.8. Administer toradol x1 for pain. Consider switching to clear liquid diet.    LOS: 2 days    Earlyne Iba , PA-S2 [facility] 05/03/2016, 9:01 AM Pager: [phone] Consults: [phone] Mon-Fri 7:00 am-4:30 pm Sat-Sun 7:00 am-11:30 am

## 2016-05-04 LAB — PREPARE RBC (CROSSMATCH)

## 2016-05-04 LAB — CBC
HCT: 19.9 % — ABNORMAL LOW (ref 36.0–46.0)
HCT: 27.6 % — ABNORMAL LOW (ref 36.0–46.0)
HEMOGLOBIN: 6.7 g/dL — AB (ref 12.0–15.0)
HEMOGLOBIN: 9.5 g/dL — AB (ref 12.0–15.0)
MCH: 30.9 pg (ref 26.0–34.0)
MCH: 30.8 pg (ref 26.0–34.0)
MCHC: 33.7 g/dL (ref 30.0–36.0)
MCHC: 34.4 g/dL (ref 30.0–36.0)
MCV: 91.7 fL (ref 78.0–100.0)
MCV: 89.6 fL (ref 78.0–100.0)
Platelets: 173 10*3/uL (ref 150–400)
Platelets: 179 10*3/uL (ref 150–400)
RBC: 2.17 MIL/uL — AB (ref 3.87–5.11)
RBC: 3.08 MIL/uL — ABNORMAL LOW (ref 3.87–5.11)
RDW: 13.6 % (ref 11.5–15.5)
RDW: 14.1 % (ref 11.5–15.5)
WBC: 6.6 10*3/uL (ref 4.0–10.5)
WBC: 8.6 10*3/uL (ref 4.0–10.5)

## 2016-05-04 LAB — BASIC METABOLIC PANEL
ANION GAP: 2 — AB (ref 5–15)
BUN: 10 mg/dL (ref 6–20)
CHLORIDE: 104 mmol/L (ref 101–111)
CO2: 30 mmol/L (ref 22–32)
Calcium: 8.2 mg/dL — ABNORMAL LOW (ref 8.9–10.3)
Creatinine, Ser: 0.77 mg/dL (ref 0.44–1.00)
GFR calc Af Amer: >60 mL/min (ref >60)
GFR calc non Af Amer: >60 mL/min (ref >60)
Glucose, Bld: 124 mg/dL — ABNORMAL HIGH (ref 65–99)
POTASSIUM: 4.2 mmol/L (ref 3.5–5.1)
SODIUM: 136 mmol/L (ref 135–145)

## 2016-05-04 MED ORDER — OXYCODONE HCL 5 MG PO TABS: 10.0000 mg | ORAL_TABLET | ORAL | Status: DC | PRN

## 2016-05-04 MED ORDER — OXYCODONE-ACETAMINOPHEN 7.5-325 MG PO TABS: 2.0000 | ORAL_TABLET | Freq: Four times a day (QID) | ORAL | Status: DC | PRN

## 2016-05-04 MED ORDER — SODIUM CHLORIDE 0.9 % IV SOLN: Freq: Once | INTRAVENOUS | Status: DC

## 2016-05-04 MED ORDER — MORPHINE SULFATE (PF) 2 MG/ML IV SOLN: 2.0000 mg | INTRAVENOUS | Status: DC | PRN

## 2016-05-04 MED ORDER — ACETAMINOPHEN 325 MG PO TABS
650.0000 mg | ORAL_TABLET | Freq: Four times a day (QID) | ORAL | Status: DC | PRN
  Administered 2016-05-04: 650 mg via ORAL
  Filled 2016-05-04: qty 2

## 2016-05-04 MED ORDER — ZOLPIDEM TARTRATE 5 MG PO TABS
5.0000 mg | ORAL_TABLET | Freq: Every evening | ORAL | Status: DC | PRN
  Administered 2016-05-04 – 2016-05-05 (×2): 5 mg via ORAL
  Filled 2016-05-04 (×2): qty 1

## 2016-05-04 NOTE — Progress Notes (Addendum)
Nurse called [facility] to speak with on-call provider to make provider aware of patients critical lab value as follows: hemoglobin 6.7. Answering service will have provider return call to nurse.   Dr. Donell BeersByerly returned call to nurse. Per Dr. Donell BeersByerly prepare and transfuse 2 units of packed red blood cells for critical hemoglobin of 6.7. Verbal order has been placed.

## 2016-05-04 NOTE — Progress Notes (Signed)
3 Days Post-Op  Subjective: Feels better today, foley in, passing flatus, pain well controlled now, ambulated, repeat hct still low and hb below 7  Objective: Vital signs in last 24 hours: Temp:  [98.1 F (36.7 C)-99.3 F (37.4 C)] 98.3 F (36.8 C) (01/21 0537) Pulse Rate:  [66-76] 66 (01/21 0537) Resp:  [13-17] 16 (01/21 0537) BP: (98-112)/(50-60) 103/58 (01/21 0537) SpO2:  [98 %-100 %] 100 % (01/21 0537) Last BM Date: 04/30/16  Intake/Output from previous day: 01/20 0701 - 01/21 0700 In: 1360 [P.O.:60; I.V.:1100; IV Piggyback:200] Out: 1875 [Urine:1875] Intake/Output this shift: No intake/output data recorded.  General appearance: no distress Resp: clear to auscultation bilaterally Cardio: regular rate and rhythm GI: soft approp tender nd bs present, wound dressed and clean  Lab Results:   Recent Labs  05/03/16 1253 05/04/16 0102  WBC 8.3 6.6  HGB 7.2* 6.7*  HCT 21.6* 19.9*  PLT 164 173   BMET  Recent Labs  05/03/16 0448 05/04/16 0102  NA 134* 136  K 4.5 4.2  CL 103 104  CO2 27 30  GLUCOSE 119* 124*  BUN 8 10  CREATININE 0.64 0.77  CALCIUM 7.9* 8.2*    Anti-infectives: Anti-infectives    Start     Dose/Rate Route Frequency Ordered Stop   05/01/16 1245  cefoTEtan (CEFOTAN) 2 g in dextrose 5 % 50 mL IVPB  Status:  Discontinued     2 g 100 mL/hr over 30 Minutes Intravenous Every 12 hours 05/01/16 1235 05/01/16 1711      Assessment/Plan: POD 2 right colectomy for cecal volvulus- Dr Gerrit FriendsGerkin  1. Will dc pca today, place on oral pain meds with iv backup, continue robaxin 2. pulm toilet, oob 3. Abl anemia- she appears to have bled postop but is stable. No evidence of bleeding from rectum so dont think it is anastomotic in nature.  Will transfuse today and recheck. dont think needs to return to or or need any imaging at this point. 4. Hold lovenox given hct, scds 5. Dc foley today again 6. Path pending  [facility],Kristy Murillo 05/04/2016

## 2016-05-05 ENCOUNTER — Other Ambulatory Visit: Payer: Self-pay | Admitting: Internal Medicine

## 2016-05-05 LAB — CBC
HEMATOCRIT: 25.2 % — AB (ref 36.0–46.0)
Hemoglobin: 8.7 g/dL — ABNORMAL LOW (ref 12.0–15.0)
MCH: 30.9 pg (ref 26.0–34.0)
MCHC: 34.5 g/dL (ref 30.0–36.0)
MCV: 89.4 fL (ref 78.0–100.0)
PLATELETS: 194 10*3/uL (ref 150–400)
RBC: 2.82 MIL/uL — ABNORMAL LOW (ref 3.87–5.11)
RDW: 14 % (ref 11.5–15.5)
WBC: 7.2 10*3/uL (ref 4.0–10.5)

## 2016-05-05 LAB — TYPE AND SCREEN
BLOOD PRODUCT EXPIRATION DATE: 201802092359
Blood Product Expiration Date: 201802172359
ISSUE DATE / TIME: 201801210501
ISSUE DATE / TIME: 201801210813
UNIT TYPE AND RH: 9500
Unit Type and Rh: 9500

## 2016-05-05 MED ORDER — SIMETHICONE 80 MG PO CHEW: 80.0000 mg | CHEWABLE_TABLET | ORAL | Status: DC | PRN

## 2016-05-05 MED ORDER — OXYCODONE HCL 5 MG PO TABS: 5.0000 mg | ORAL_TABLET | ORAL | Status: DC | PRN

## 2016-05-05 NOTE — Progress Notes (Signed)
Patient ID: Kristy Murillo, female   DOB: 1955-11-22, 61 y.o.   MRN: [id number] [facility] Progress Note:   4 Days Post-Op  Subjective: Mental status is clear.  Feeling better after transfusion.   Objective: Vital signs in last 24 hours: Temp:  [98.4 F (36.9 C)-100 F (37.8 C)] 98.4 F (36.9 C) (01/22 0609) Pulse Rate:  [63-82] 63 (01/22 0609) Resp:  [12-14] 14 (01/22 0609) BP: (101-110)/(54-88) 107/54 (01/22 0609) SpO2:  [95 %-100 %] 95 % (01/22 0609)  Intake/Output from previous day: 01/21 0701 - 01/22 0700 In: 3029.3 [P.O.:840; I.V.:1473.3; Blood:661; IV Piggyback:55] Out: 1778 [YKDXI:3382; Stool:3] Intake/Output this shift: Total I/O In: 120 [P.O.:120] Out: -   Physical Exam: Work of breathing is not labored.  Minimal blood per rectum.    Lab Results:  Results for orders placed or performed during the hospital encounter of 05/01/16 (from the past 48 hour(s))  Type and screen [facility], do at same time at next cbc     Status: None   Collection Time: 05/03/16 12:47 PM  Result Value Ref Range   ISSUE DATE / TIME [id number]    Blood Product Unit Number [id number]    PRODUCT CODE E0336V00    Unit Type and Rh 9500    Blood Product Expiration Date [id number]    ISSUE DATE / TIME [id number]    Blood Product Unit Number [id number]    PRODUCT CODE [id number]    Unit Type and Rh 9500    Blood Product Expiration Date [id number]   ABO/Rh     Status: None   Collection Time: 05/03/16 12:47 PM  Result Value Ref Range   ABO/RH(D) O NEG   CBC     Status: Abnormal   Collection Time: 05/03/16 12:53 PM  Result Value Ref Range   WBC 8.3 4.0 - 10.5 K/uL   RBC 2.35 (L) 3.87 - 5.11 MIL/uL   Hemoglobin 7.2 (L) 12.0 - 15.0 g/dL   HCT 21.6 (L) 36.0 - 46.0 %   MCV 91.9 78.0 - 100.0 fL   MCH 30.6 26.0 - 34.0 pg   MCHC 33.3 30.0 - 36.0 g/dL   RDW 13.7 11.5 - 15.5 %   Platelets 164 150 - 400 K/uL  CBC     Status: Abnormal   Collection Time:  05/04/16  1:02 AM  Result Value Ref Range   WBC 6.6 4.0 - 10.5 K/uL   RBC 2.17 (L) 3.87 - 5.11 MIL/uL   Hemoglobin 6.7 (LL) 12.0 - 15.0 g/dL    Comment: REPEATED TO VERIFY CRITICAL RESULT CALLED TO, READ BACK BY AND VERIFIED WITH: H MARSHALL RN 0147 05/04/16 A NAVARRO    HCT 19.9 (L) 36.0 - 46.0 %   MCV 91.7 78.0 - 100.0 fL   MCH 30.9 26.0 - 34.0 pg   MCHC 33.7 30.0 - 36.0 g/dL   RDW 13.6 11.5 - 15.5 %   Platelets 173 150 - 400 K/uL  Basic metabolic panel     Status: Abnormal   Collection Time: 05/04/16  1:02 AM  Result Value Ref Range   Sodium 136 135 - 145 mmol/L   Potassium 4.2 3.5 - 5.1 mmol/L   Chloride 104 101 - 111 mmol/L   CO2 30 22 - 32 mmol/L   Glucose, Bld 124 (H) 65 - 99 mg/dL   BUN 10 6 - 20 mg/dL   Creatinine, Ser 0.77 0.44 - 1.00 mg/dL   Calcium 8.2 (L) 8.9 - 10.3 mg/dL  GFR calc non Af Amer >60 >60 mL/min   GFR calc Af Amer >60 >60 mL/min    Comment: (NOTE) The eGFR has been calculated using the CKD EPI equation. This calculation has not been validated in all clinical situations. eGFR's persistently <60 mL/min signify possible Chronic Kidney Disease.    Anion gap 2 (L) 5 - 15  Prepare RBC     Status: None   Collection Time: 05/04/16  3:00 AM  Result Value Ref Range   Order Confirmation ORDER PROCESSED BY BLOOD BANK   CBC     Status: Abnormal   Collection Time: 05/04/16  1:58 PM  Result Value Ref Range   WBC 8.6 4.0 - 10.5 K/uL   RBC 3.08 (L) 3.87 - 5.11 MIL/uL   Hemoglobin 9.5 (L) 12.0 - 15.0 g/dL    Comment: RESULT REPEATED AND VERIFIED DELTA CHECK NOTED POST TRANSFUSION SPECIMEN    HCT 27.6 (L) 36.0 - 46.0 %   MCV 89.6 78.0 - 100.0 fL   MCH 30.8 26.0 - 34.0 pg   MCHC 34.4 30.0 - 36.0 g/dL   RDW 14.1 11.5 - 15.5 %   Platelets 179 150 - 400 K/uL  CBC     Status: Abnormal   Collection Time: 05/05/16  4:21 AM  Result Value Ref Range   WBC 7.2 4.0 - 10.5 K/uL   RBC 2.82 (L) 3.87 - 5.11 MIL/uL   Hemoglobin 8.7 (L) 12.0 - 15.0 g/dL   HCT 25.2  (L) 36.0 - 46.0 %   MCV 89.4 78.0 - 100.0 fL   MCH 30.9 26.0 - 34.0 pg   MCHC 34.5 30.0 - 36.0 g/dL   RDW 14.0 11.5 - 15.5 %   Platelets 194 150 - 400 K/uL    Radiology/Results: No results found.  Anti-infectives: Anti-infectives    Start     Dose/Rate Route Frequency Ordered Stop   05/01/16 1245  cefoTEtan (CEFOTAN) 2 g in dextrose 5 % 50 mL IVPB  Status:  Discontinued     2 g 100 mL/hr over 30 Minutes Intravenous Every 12 hours 05/01/16 1235 05/01/16 1711      Assessment/Plan: Problem List: Patient Active Problem List   Diagnosis Date Noted  . Cecal volvulus (HCC) 05/01/2016    Postop bleed requiring 2 unit transfusion.  Hg 8.7 currently.  Will monitor.   4 Days Post-Op    LOS: 4 days   Matt B. Martin, MD, FACS  [facility] [phone] beeper [phone]  05/05/2016 9:56 AM 

## 2016-05-05 NOTE — Anesthesia Postprocedure Evaluation (Signed)
Anesthesia Post Note  Patient: Verdis PrimeMary L Sar  Procedure(s) Performed: Procedure(s) (LRB): EXPLORATORY LAPAROTOMY WITH PARTIAL COLECTOMY (N/A)  Patient location during evaluation: PACU Anesthesia Type: General Level of consciousness: awake and alert Pain management: pain level controlled Vital Signs Assessment: post-procedure vital signs reviewed and stable Respiratory status: spontaneous breathing, nonlabored ventilation, respiratory function stable and patient connected to nasal cannula oxygen Cardiovascular status: blood pressure returned to baseline and stable Postop Assessment: no signs of nausea or vomiting Anesthetic complications: no        Last Vitals:  Vitals:   05/05/16 0609 05/05/16 1334  BP: (!) 107/54 115/63  Pulse: 63 70  Resp: 14 13  Temp: 36.9 C 37.1 C    Last Pain:  Vitals:   05/05/16 1334  TempSrc: Oral  PainSc:    Pain Goal: Patients Stated Pain Goal: 3 (05/04/16 2138)               Cecile HearingStephen Edward Turk

## 2016-05-05 NOTE — Progress Notes (Signed)
Patient ID: Kristy Murillo, female   DOB: Jun 10, 1955, 61 y.o.   MRN: [id number]  [facility]  4 Days Post-Op  Subjective: Feeling much better today. Still having some gas pains, but overall improving. BM this AM maroon. Denies n/v. Tolerating full liquids.  Objective: Vital signs in last 24 hours: Temp:  [98.1 F (36.7 C)-100 F (37.8 C)] 98.4 F (36.9 C) (01/22 0609) Pulse Rate:  [63-82] 63 (01/22 0609) Resp:  [12-16] 14 (01/22 0609) BP: (101-113)/(54-88) 107/54 (01/22 0609) SpO2:  [95 %-100 %] 95 % (01/22 0609) Last BM Date: 05/04/16 (small BM today)  Intake/Output from previous day: 01/21 0701 - 01/22 0700 In: 3029.3 [P.O.:840; I.V.:1473.3; Blood:661; IV Piggyback:55] Out: 1778 [Urine:1775; Stool:3] Intake/Output this shift: No intake/output data recorded.  PE: Gen:  Alert, NAD, pleasant Pulm:  Effort normal Abd: Soft, mild distension, appropriately tender, +BS, midline incision C/D/I with staples intact and honeycomb dressing in place  Lab Results:   Recent Labs  05/04/16 1358 05/05/16 0421  WBC 8.6 7.2  HGB 9.5* 8.7*  HCT 27.6* 25.2*  PLT 179 194   BMET  Recent Labs  05/03/16 0448 05/04/16 0102  NA 134* 136  K 4.5 4.2  CL 103 104  CO2 27 30  GLUCOSE 119* 124*  BUN 8 10  CREATININE 0.64 0.77  CALCIUM 7.9* 8.2*   PT/INR No results for input(s): LABPROT, INR in the last 72 hours. CMP     Component Value Date/Time   NA 136 05/04/2016 0102   K 4.2 05/04/2016 0102   CL 104 05/04/2016 0102   CO2 30 05/04/2016 0102   GLUCOSE 124 (H) 05/04/2016 0102   BUN 10 05/04/2016 0102   CREATININE 0.77 05/04/2016 0102   CALCIUM 8.2 (L) 05/04/2016 0102   PROT 8.1 05/01/2016 0850   ALBUMIN 4.9 05/01/2016 0850   AST 30 05/01/2016 0850   ALT 21 05/01/2016 0850   ALKPHOS 52 05/01/2016 0850   BILITOT 1.0 05/01/2016 0850   GFRNONAA >60 05/04/2016 0102   GFRAA >60 05/04/2016 0102   Lipase     Component Value Date/Time   LIPASE 40  05/01/2016 0850       Studies/Results: No results found.  Anti-infectives: Anti-infectives    Start     Dose/Rate Route Frequency Ordered Stop   05/01/16 1245  cefoTEtan (CEFOTAN) 2 g in dextrose 5 % 50 mL IVPB  Status:  Discontinued     2 g 100 mL/hr over 30 Minutes Intravenous Every 12 hours 05/01/16 1235 05/01/16 1711       Assessment/Plan POD 3 right colectomy for cecal volvulus- Dr Gerrit FriendsGerkin - bowel function returned, tolerating full liquids - path pending  ABL anemia - [facility] Hg 8.7 today from 9.5 yesterday - +maroon BM this morning, continue to monitor  ID - cefotetan perioperative FEN - soft diet VTE - SCDs, ambulation, hold chemical DVT prophylaxis due to bleeding  Plan - advance to soft diet. Continue to encourage ambulation. Simethicone for gas pains. Check CBC in AM, monitor for signs of bleeding   LOS: 4 days    Edson SnowballBROOKE A MILLER , [facility] 05/05/2016, 7:43 AM Pager: [phone] Consults: [phone] Mon-Fri 7:00 am-4:30 pm Sat-Sun 7:00 am-11:30 am

## 2016-05-06 LAB — CBC
HCT: 27.3 % — ABNORMAL LOW (ref 36.0–46.0)
HEMOGLOBIN: 9.2 g/dL — AB (ref 12.0–15.0)
MCH: 29.7 pg (ref 26.0–34.0)
MCHC: 33.7 g/dL (ref 30.0–36.0)
MCV: 88.1 fL (ref 78.0–100.0)
Platelets: 255 10*3/uL (ref 150–400)
RBC: 3.1 MIL/uL — AB (ref 3.87–5.11)
RDW: 13.6 % (ref 11.5–15.5)
WBC: 6.8 10*3/uL (ref 4.0–10.5)

## 2016-05-06 NOTE — Progress Notes (Signed)
Patient ID: Kristy Murillo Kristy Murillo, female   DOB: 01-Nov-1955, 61 y.o.   MRN: [id number]  [facility]  5 Days Post-Op  Subjective: No complaints. Feeling better this morning. Patient reports less abdominal pain. Tolerating soft diet. BM yesterday was still maroon.  Objective: Vital signs in last 24 hours: Temp:  [98.8 F (37.1 C)-99.2 F (37.3 C)] 99.1 F (37.3 C) (01/23 0543) Pulse Rate:  [68-72] 72 (01/23 0543) Resp:  [13-15] 15 (01/23 0543) BP: (98-115)/(57-63) 98/57 (01/23 0543) SpO2:  [95 %-98 %] 98 % (01/23 0543) Last BM Date: 05/05/16  Intake/Output from previous day: 01/22 0701 - 01/23 0700 In: 1671.7 [P.O.:600; I.V.:1071.7] Out: 900 [Urine:900] Intake/Output this shift: No intake/output data recorded.  PE: Gen:  Alert, NAD, pleasant Pulm:  Effort normal Abd: Soft, mild distension, appropriately tender, +BS, midline incision C/D/I with staples intact and honeycomb dressing in place  Lab Results:   Recent Labs  05/05/16 0421 05/06/16 0416  WBC 7.2 6.8  HGB 8.7* 9.2*  HCT 25.2* 27.3*  PLT 194 255   BMET  Recent Labs  05/04/16 0102  NA 136  K 4.2  CL 104  CO2 30  GLUCOSE 124*  BUN 10  CREATININE 0.77  CALCIUM 8.2*   PT/INR No results for input(s): LABPROT, INR in the last 72 hours. CMP     Component Value Date/Time   NA 136 05/04/2016 0102   K 4.2 05/04/2016 0102   CL 104 05/04/2016 0102   CO2 30 05/04/2016 0102   GLUCOSE 124 (H) 05/04/2016 0102   BUN 10 05/04/2016 0102   CREATININE 0.77 05/04/2016 0102   CALCIUM 8.2 (Kristy) 05/04/2016 0102   PROT 8.1 05/01/2016 0850   ALBUMIN 4.9 05/01/2016 0850   AST 30 05/01/2016 0850   ALT 21 05/01/2016 0850   ALKPHOS 52 05/01/2016 0850   BILITOT 1.0 05/01/2016 0850   GFRNONAA >60 05/04/2016 0102   GFRAA >60 05/04/2016 0102   Lipase     Component Value Date/Time   LIPASE 40 05/01/2016 0850       Studies/Results: No results found.  Anti-infectives: Anti-infectives    Start      Dose/Rate Route Frequency Ordered Stop   05/01/16 1245  cefoTEtan (CEFOTAN) 2 g in dextrose 5 % 50 mL IVPB  Status:  Discontinued     2 g 100 mL/hr over 30 Minutes Intravenous Every 12 hours 05/01/16 1235 05/01/16 1711       Assessment/Plan POD 4 right colectomy for cecal volvulus- Dr Gerrit FriendsGerkin - bowel function returned, tolerating soft diet - 1 path back and 1 path pending: - BENIGN COLON WITH ADHESIONS AND EVIDENCE OF VOLVULUS. - MARGINS ARE UNINVOLVED BY PROCESS. - FIVE BENIGN LYMPH NODES (0/5). - INCIDENTAL BENIGN UNREMARKABLE APPENDIX. - NO TUMOR SEEN.  ABL anemia - [facility]. Hg trending up, 9.2 today from 8.7 - +maroon BM yesterday  ID - cefotetan perioperative FEN - soft diet VTE - SCDs, ambulation, hold chemical DVT prophylaxis due to bleeding  Plan - patient stable and ready for discharge. Continue soft diet. Patient will follow-up next week for staple removal, and she will have CBC drawn the day before.   LOS: 5 days    Edson SnowballBROOKE A MILLER , [facility] 05/06/2016, 8:16 AM Pager: [phone] Consults: [phone] Mon-Fri 7:00 am-4:30 pm Sat-Sun 7:00 am-11:30 am

## 2016-05-06 NOTE — Discharge Instructions (Signed)
[facility]      [facility] [place] [phone]  OPEN ABDOMINAL SURGERY: POST OP INSTRUCTIONS  Always review your discharge instruction sheet given to you by the facility where your surgery was performed.  IF YOU HAVE DISABILITY OR FAMILY LEAVE FORMS, YOU MUST BRING THEM TO THE OFFICE FOR PROCESSING.  PLEASE DO NOT GIVE THEM TO YOUR DOCTOR.  1. A prescription for pain medication may be given to you upon discharge.  Take your pain medication as prescribed, if needed.  If narcotic pain medicine is not needed, then you may take acetaminophen (Tylenol) or ibuprofen (Advil) as needed. 2. Take your usually prescribed medications unless otherwise directed. 3. If you need a refill on your pain medication, please contact your pharmacy. They will contact our office to request authorization.  Prescriptions will not be filled after 5pm or on week-ends. 4. You should follow a light/soft diet the first few days after arrival home, such as soup and crackers, pudding, etc. A high-fiber, low fat diet can be resumed as tolerated over the next few weeks.   Be sure to include lots of fluids daily. Most patients will experience some swelling and bruising on the chest and neck area.  Ice packs will help.  Swelling and bruising can take several days to resolve 5. Most patients will experience some swelling and bruising in the area of the incision. Ice pack will help. Swelling and bruising can take several days to resolve..  6. It is common to experience some constipation if taking pain medication after surgery.  Increasing fluid intake and taking a stool softener will usually help or prevent this problem from occurring.  A mild laxative (Milk of Magnesia or Miralax) should be taken according to package directions if there are no bowel movements after 48 hours. 7.  You may remove honeycomb dressing tomorrow and replace with dry dressing as needed.  Staples will be removed at the office during your follow-up visit. You may  find that a light gauze bandage over your incision may keep your staples from being rubbed or pulled. You may shower and replace the bandage daily. 8. ACTIVITIES:  You may resume regular (light) daily activities beginning the next day--such as daily self-care, walking, climbing stairs--gradually increasing activities as tolerated.  You may have sexual intercourse when it is comfortable.  Refrain from any heavy lifting or straining until approved by your doctor. a. You may drive when you no longer are taking prescription pain medication, you can comfortably wear a seatbelt, and you can safely maneuver your car and apply brakes 9. You should see your doctor in the office for a follow-up appointment approximately two weeks after your surgery.  Make sure that you call for this appointment within a day or two after you arrive home to insure a convenient appointment time. OTHER INSTRUCTIONS:  _____________________________________________________________ _____________________________________________________________  WHEN TO CALL YOUR DOCTOR: 1. Fever over 101.0 2. Inability to urinate 3. Nausea and/or vomiting 4. Extreme swelling or bruising 5. Continued bleeding from incision. 6. Increased pain, redness, or drainage from the incision. 7. Difficulty swallowing or breathing 8. Muscle cramping or spasms. 9. Numbness or tingling in hands or feet or around lips.  The clinic staff is available to answer your questions during regular business hours.  Please dont hesitate to call and ask to speak to one of the nurses if you have concerns.  For further questions, please visit [email or web address]

## 2016-05-06 NOTE — Discharge Summary (Signed)
[facility] Discharge Summary   Patient ID: Kristy Murillo MRN: [id number] DOB/AGE: 1956/02/23 61 y.o.  Admit date: 05/01/2016 Discharge date: 05/06/2016  Admitting Diagnosis: Cecal volvulus  Discharge Diagnosis Patient Active Problem List   Diagnosis Date Noted  . 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 Cecal volvulus with obstruction. Trace free pelvic fluid minimal fluid stranding along the right paracolic gutter and omentum.  2. Faint reticulonodular opacity in the right lower lobe compatible with mild atypical infectious bronchiolitis. 3. Lumbar spondylosis and degenerative disc disease, contributing to impingement at all levels between L2 and S1.  Procedures Dr. Gerrit FriendsGerkin (05/01/16) - Open right colectomy   Hospital Course:  Kristy Murillo is a 61yo female [facility] with 1 day of worsening abdominal pain, nausea, and vomiting.  Workup showed cecal volvulus with obstruction.  Patient was admitted and underwent procedure listed above later that day.  Tolerated procedure well and was transferred to the floor.  She did have some acute blood loss anemia postoperatively which required 2 uPRBC; after transfusion her hemoglobin rose appropriately. Diet was advanced as tolerated.  On POD4 the patient was voiding well, tolerating diet, ambulating well, pain well controlled, vital signs stable, hemoglobin stable, incisions c/d/i and felt stable for discharge home.  Patient will follow up in our office next week for staple removal and labs (CBC) to recheck hemoglobin. She will see Dr. Gerrit FriendsGerkin 2 weeks later for postop follow-up.  Physical Exam: Gen: Alert, NAD, pleasant Pulm: Effort normal Abd: Soft, mild distension, appropriately tender, +BS, midlineincisionC/D/I with staples intact and honeycomb dressing in place  Allergies as of 05/06/2016      Reactions   Codeine Nausea And Vomiting       Medication List    STOP taking these medications   ibuprofen 200 MG tablet Commonly known as:  ADVIL,MOTRIN     TAKE these medications   carboxymethylcellulose 0.5 % Soln Commonly known as:  REFRESH PLUS Place 1 drop into both eyes daily.   cholecalciferol 1000 units tablet Commonly known as:  VITAMIN D Take 1,000 Units by mouth daily.   Magnesium 100 MG Tabs Take 100-200 mg by mouth at bedtime as needed (insomnia).   MELATONIN PO Take 2 tablets by mouth at bedtime as needed (insomnia).   OVER THE COUNTER MEDICATION Take 1 tablet by mouth daily. Iodine   sertraline 50 MG tablet Commonly known as:  ZOLOFT Take 50 mg by mouth daily.   zolpidem 5 MG tablet Commonly known as:  AMBIEN Take 5 mg by mouth at bedtime as needed for sleep.        Follow-up Information    Velora HecklerGERKIN,TODD M, MD Follow up.   Specialty:  General Surgery Why:  Your appointment is Wednesday 05/21/16 @ 11:15AM with Dr. Gerrit FriendsGerkin for follow-up from your surgery. Please arrive 15min early for check in. Contact information: [address] [place] [id number] [phone]        [facility] [place]. Go on 05/13/2016.   Specialty:  General Surgery Why:  Your appointment is Tuesday 05/13/16 @ 9:30AM with a nurse for staple removal. Please arrive 30min early for check in. [facility] lab (in our same building) they day before your appointment to have blood work (CBC). Contact information: [address] [place] [id number] [phone]          Signed: Nehemiah SettleBROOKE  Naaman Plummer, [facility] 05/06/2016, 2:32 PM Pager: [phone] Consults: [phone] Mon-Fri 7:00 am-4:30 pm Sat-Sun 7:00 am-11:30 am

## 2016-05-12 DIAGNOSIS — D649 Anemia, unspecified: Secondary | ICD-10-CM | POA: Diagnosis not present

## 2016-06-02 DIAGNOSIS — E559 Vitamin D deficiency, unspecified: Secondary | ICD-10-CM | POA: Diagnosis not present

## 2016-06-02 DIAGNOSIS — D500 Iron deficiency anemia secondary to blood loss (chronic): Secondary | ICD-10-CM | POA: Diagnosis not present

## 2016-06-02 DIAGNOSIS — F419 Anxiety disorder, unspecified: Secondary | ICD-10-CM | POA: Diagnosis not present

## 2016-07-03 DIAGNOSIS — M9901 Segmental and somatic dysfunction of cervical region: Secondary | ICD-10-CM | POA: Diagnosis not present

## 2016-07-03 DIAGNOSIS — M5126 Other intervertebral disc displacement, lumbar region: Secondary | ICD-10-CM | POA: Diagnosis not present

## 2016-07-03 DIAGNOSIS — M9903 Segmental and somatic dysfunction of lumbar region: Secondary | ICD-10-CM | POA: Diagnosis not present

## 2016-07-03 DIAGNOSIS — M9905 Segmental and somatic dysfunction of pelvic region: Secondary | ICD-10-CM | POA: Diagnosis not present

## 2016-07-08 DIAGNOSIS — M9905 Segmental and somatic dysfunction of pelvic region: Secondary | ICD-10-CM | POA: Diagnosis not present

## 2016-07-08 DIAGNOSIS — M9901 Segmental and somatic dysfunction of cervical region: Secondary | ICD-10-CM | POA: Diagnosis not present

## 2016-07-08 DIAGNOSIS — M9903 Segmental and somatic dysfunction of lumbar region: Secondary | ICD-10-CM | POA: Diagnosis not present

## 2016-07-08 DIAGNOSIS — M5126 Other intervertebral disc displacement, lumbar region: Secondary | ICD-10-CM | POA: Diagnosis not present

## 2016-07-16 DIAGNOSIS — M5126 Other intervertebral disc displacement, lumbar region: Secondary | ICD-10-CM | POA: Diagnosis not present

## 2016-07-16 DIAGNOSIS — M9905 Segmental and somatic dysfunction of pelvic region: Secondary | ICD-10-CM | POA: Diagnosis not present

## 2016-07-16 DIAGNOSIS — M9903 Segmental and somatic dysfunction of lumbar region: Secondary | ICD-10-CM | POA: Diagnosis not present

## 2016-07-16 DIAGNOSIS — M9901 Segmental and somatic dysfunction of cervical region: Secondary | ICD-10-CM | POA: Diagnosis not present

## 2016-08-11 DIAGNOSIS — D3709 Neoplasm of uncertain behavior of other specified sites of the oral cavity: Secondary | ICD-10-CM | POA: Diagnosis not present

## 2016-09-29 DIAGNOSIS — M9905 Segmental and somatic dysfunction of pelvic region: Secondary | ICD-10-CM | POA: Diagnosis not present

## 2016-09-29 DIAGNOSIS — M5126 Other intervertebral disc displacement, lumbar region: Secondary | ICD-10-CM | POA: Diagnosis not present

## 2016-09-29 DIAGNOSIS — M9901 Segmental and somatic dysfunction of cervical region: Secondary | ICD-10-CM | POA: Diagnosis not present

## 2016-09-29 DIAGNOSIS — M9903 Segmental and somatic dysfunction of lumbar region: Secondary | ICD-10-CM | POA: Diagnosis not present

## 2016-11-17 DIAGNOSIS — M9901 Segmental and somatic dysfunction of cervical region: Secondary | ICD-10-CM | POA: Diagnosis not present

## 2016-11-17 DIAGNOSIS — M9903 Segmental and somatic dysfunction of lumbar region: Secondary | ICD-10-CM | POA: Diagnosis not present

## 2016-11-17 DIAGNOSIS — M5126 Other intervertebral disc displacement, lumbar region: Secondary | ICD-10-CM | POA: Diagnosis not present

## 2016-11-17 DIAGNOSIS — M9905 Segmental and somatic dysfunction of pelvic region: Secondary | ICD-10-CM | POA: Diagnosis not present

## 2016-11-27 DIAGNOSIS — M9905 Segmental and somatic dysfunction of pelvic region: Secondary | ICD-10-CM | POA: Diagnosis not present

## 2016-11-27 DIAGNOSIS — M2141 Flat foot [pes planus] (acquired), right foot: Secondary | ICD-10-CM | POA: Diagnosis not present

## 2016-11-27 DIAGNOSIS — M5417 Radiculopathy, lumbosacral region: Secondary | ICD-10-CM | POA: Diagnosis not present

## 2016-11-27 DIAGNOSIS — M9903 Segmental and somatic dysfunction of lumbar region: Secondary | ICD-10-CM | POA: Diagnosis not present

## 2016-12-30 DIAGNOSIS — M9903 Segmental and somatic dysfunction of lumbar region: Secondary | ICD-10-CM | POA: Diagnosis not present

## 2016-12-30 DIAGNOSIS — M2141 Flat foot [pes planus] (acquired), right foot: Secondary | ICD-10-CM | POA: Diagnosis not present

## 2016-12-30 DIAGNOSIS — M9905 Segmental and somatic dysfunction of pelvic region: Secondary | ICD-10-CM | POA: Diagnosis not present

## 2016-12-30 DIAGNOSIS — M5417 Radiculopathy, lumbosacral region: Secondary | ICD-10-CM | POA: Diagnosis not present

## 2017-01-06 DIAGNOSIS — M2141 Flat foot [pes planus] (acquired), right foot: Secondary | ICD-10-CM | POA: Diagnosis not present

## 2017-01-06 DIAGNOSIS — M9905 Segmental and somatic dysfunction of pelvic region: Secondary | ICD-10-CM | POA: Diagnosis not present

## 2017-01-06 DIAGNOSIS — M9903 Segmental and somatic dysfunction of lumbar region: Secondary | ICD-10-CM | POA: Diagnosis not present

## 2017-01-06 DIAGNOSIS — M5417 Radiculopathy, lumbosacral region: Secondary | ICD-10-CM | POA: Diagnosis not present

## 2017-01-16 DIAGNOSIS — R5383 Other fatigue: Secondary | ICD-10-CM | POA: Diagnosis not present

## 2017-01-16 DIAGNOSIS — E559 Vitamin D deficiency, unspecified: Secondary | ICD-10-CM | POA: Diagnosis not present

## 2017-01-16 DIAGNOSIS — D508 Other iron deficiency anemias: Secondary | ICD-10-CM | POA: Diagnosis not present

## 2017-01-16 DIAGNOSIS — F419 Anxiety disorder, unspecified: Secondary | ICD-10-CM | POA: Diagnosis not present

## 2017-01-21 DIAGNOSIS — M5417 Radiculopathy, lumbosacral region: Secondary | ICD-10-CM | POA: Diagnosis not present

## 2017-01-21 DIAGNOSIS — M9905 Segmental and somatic dysfunction of pelvic region: Secondary | ICD-10-CM | POA: Diagnosis not present

## 2017-01-21 DIAGNOSIS — M9903 Segmental and somatic dysfunction of lumbar region: Secondary | ICD-10-CM | POA: Diagnosis not present

## 2017-01-21 DIAGNOSIS — M2141 Flat foot [pes planus] (acquired), right foot: Secondary | ICD-10-CM | POA: Diagnosis not present

## 2017-02-11 DIAGNOSIS — M9905 Segmental and somatic dysfunction of pelvic region: Secondary | ICD-10-CM | POA: Diagnosis not present

## 2017-02-11 DIAGNOSIS — M2141 Flat foot [pes planus] (acquired), right foot: Secondary | ICD-10-CM | POA: Diagnosis not present

## 2017-02-11 DIAGNOSIS — M5417 Radiculopathy, lumbosacral region: Secondary | ICD-10-CM | POA: Diagnosis not present

## 2017-02-11 DIAGNOSIS — M9903 Segmental and somatic dysfunction of lumbar region: Secondary | ICD-10-CM | POA: Diagnosis not present

## 2017-02-17 DIAGNOSIS — Z23 Encounter for immunization: Secondary | ICD-10-CM | POA: Diagnosis not present

## 2017-02-17 DIAGNOSIS — M79641 Pain in right hand: Secondary | ICD-10-CM | POA: Diagnosis not present

## 2017-02-17 DIAGNOSIS — M79642 Pain in left hand: Secondary | ICD-10-CM | POA: Diagnosis not present

## 2017-02-17 DIAGNOSIS — H1012 Acute atopic conjunctivitis, left eye: Secondary | ICD-10-CM | POA: Diagnosis not present

## 2017-03-12 DIAGNOSIS — M5417 Radiculopathy, lumbosacral region: Secondary | ICD-10-CM | POA: Diagnosis not present

## 2017-03-12 DIAGNOSIS — M2141 Flat foot [pes planus] (acquired), right foot: Secondary | ICD-10-CM | POA: Diagnosis not present

## 2017-03-12 DIAGNOSIS — M9905 Segmental and somatic dysfunction of pelvic region: Secondary | ICD-10-CM | POA: Diagnosis not present

## 2017-03-12 DIAGNOSIS — M9903 Segmental and somatic dysfunction of lumbar region: Secondary | ICD-10-CM | POA: Diagnosis not present

## 2017-03-25 DIAGNOSIS — Z6822 Body mass index (BMI) 22.0-22.9, adult: Secondary | ICD-10-CM | POA: Diagnosis not present

## 2017-03-25 DIAGNOSIS — Z01419 Encounter for gynecological examination (general) (routine) without abnormal findings: Secondary | ICD-10-CM | POA: Diagnosis not present

## 2017-04-01 DIAGNOSIS — Z1231 Encounter for screening mammogram for malignant neoplasm of breast: Secondary | ICD-10-CM | POA: Diagnosis not present

## 2017-04-03 DIAGNOSIS — G5602 Carpal tunnel syndrome, left upper limb: Secondary | ICD-10-CM | POA: Diagnosis not present

## 2017-04-03 DIAGNOSIS — G5601 Carpal tunnel syndrome, right upper limb: Secondary | ICD-10-CM | POA: Diagnosis not present

## 2017-04-03 DIAGNOSIS — M1812 Unilateral primary osteoarthritis of first carpometacarpal joint, left hand: Secondary | ICD-10-CM | POA: Diagnosis not present

## 2017-04-03 DIAGNOSIS — M1811 Unilateral primary osteoarthritis of first carpometacarpal joint, right hand: Secondary | ICD-10-CM | POA: Diagnosis not present

## 2017-05-28 DIAGNOSIS — M5126 Other intervertebral disc displacement, lumbar region: Secondary | ICD-10-CM | POA: Diagnosis not present

## 2017-05-28 DIAGNOSIS — M9903 Segmental and somatic dysfunction of lumbar region: Secondary | ICD-10-CM | POA: Diagnosis not present

## 2017-05-28 DIAGNOSIS — M9905 Segmental and somatic dysfunction of pelvic region: Secondary | ICD-10-CM | POA: Diagnosis not present

## 2017-05-28 DIAGNOSIS — M5441 Lumbago with sciatica, right side: Secondary | ICD-10-CM | POA: Diagnosis not present

## 2017-06-05 DIAGNOSIS — M5126 Other intervertebral disc displacement, lumbar region: Secondary | ICD-10-CM | POA: Diagnosis not present

## 2017-06-05 DIAGNOSIS — M9905 Segmental and somatic dysfunction of pelvic region: Secondary | ICD-10-CM | POA: Diagnosis not present

## 2017-06-05 DIAGNOSIS — M5441 Lumbago with sciatica, right side: Secondary | ICD-10-CM | POA: Diagnosis not present

## 2017-06-05 DIAGNOSIS — M9903 Segmental and somatic dysfunction of lumbar region: Secondary | ICD-10-CM | POA: Diagnosis not present

## 2017-06-24 DIAGNOSIS — M5126 Other intervertebral disc displacement, lumbar region: Secondary | ICD-10-CM | POA: Diagnosis not present

## 2017-06-24 DIAGNOSIS — M9903 Segmental and somatic dysfunction of lumbar region: Secondary | ICD-10-CM | POA: Diagnosis not present

## 2017-06-24 DIAGNOSIS — M5441 Lumbago with sciatica, right side: Secondary | ICD-10-CM | POA: Diagnosis not present

## 2017-06-24 DIAGNOSIS — M9905 Segmental and somatic dysfunction of pelvic region: Secondary | ICD-10-CM | POA: Diagnosis not present

## 2017-06-30 DIAGNOSIS — M5126 Other intervertebral disc displacement, lumbar region: Secondary | ICD-10-CM | POA: Diagnosis not present

## 2017-06-30 DIAGNOSIS — M9903 Segmental and somatic dysfunction of lumbar region: Secondary | ICD-10-CM | POA: Diagnosis not present

## 2017-06-30 DIAGNOSIS — M9905 Segmental and somatic dysfunction of pelvic region: Secondary | ICD-10-CM | POA: Diagnosis not present

## 2017-06-30 DIAGNOSIS — M5441 Lumbago with sciatica, right side: Secondary | ICD-10-CM | POA: Diagnosis not present

## 2017-07-09 DIAGNOSIS — G5601 Carpal tunnel syndrome, right upper limb: Secondary | ICD-10-CM | POA: Diagnosis not present

## 2017-07-15 DIAGNOSIS — S76311A Strain of muscle, fascia and tendon of the posterior muscle group at thigh level, right thigh, initial encounter: Secondary | ICD-10-CM | POA: Diagnosis not present

## 2017-07-24 DIAGNOSIS — M5416 Radiculopathy, lumbar region: Secondary | ICD-10-CM | POA: Diagnosis not present

## 2017-07-28 DIAGNOSIS — M5416 Radiculopathy, lumbar region: Secondary | ICD-10-CM | POA: Diagnosis not present

## 2017-08-04 DIAGNOSIS — M5416 Radiculopathy, lumbar region: Secondary | ICD-10-CM | POA: Diagnosis not present

## 2017-08-26 DIAGNOSIS — E7800 Pure hypercholesterolemia, unspecified: Secondary | ICD-10-CM | POA: Diagnosis not present

## 2017-08-26 DIAGNOSIS — M25542 Pain in joints of left hand: Secondary | ICD-10-CM | POA: Diagnosis not present

## 2017-08-26 DIAGNOSIS — R079 Chest pain, unspecified: Secondary | ICD-10-CM | POA: Diagnosis not present

## 2017-08-26 DIAGNOSIS — M25541 Pain in joints of right hand: Secondary | ICD-10-CM | POA: Diagnosis not present

## 2017-08-26 DIAGNOSIS — F419 Anxiety disorder, unspecified: Secondary | ICD-10-CM | POA: Diagnosis not present

## 2017-09-24 DIAGNOSIS — G5601 Carpal tunnel syndrome, right upper limb: Secondary | ICD-10-CM | POA: Diagnosis not present

## 2018-01-01 DIAGNOSIS — L821 Other seborrheic keratosis: Secondary | ICD-10-CM | POA: Diagnosis not present

## 2018-01-01 DIAGNOSIS — L720 Epidermal cyst: Secondary | ICD-10-CM | POA: Diagnosis not present

## 2018-03-05 DIAGNOSIS — G5601 Carpal tunnel syndrome, right upper limb: Secondary | ICD-10-CM | POA: Diagnosis not present

## 2018-04-15 DIAGNOSIS — J209 Acute bronchitis, unspecified: Secondary | ICD-10-CM | POA: Diagnosis not present

## 2018-04-28 DIAGNOSIS — Z1231 Encounter for screening mammogram for malignant neoplasm of breast: Secondary | ICD-10-CM | POA: Diagnosis not present

## 2018-05-26 DIAGNOSIS — Z01411 Encounter for gynecological examination (general) (routine) with abnormal findings: Secondary | ICD-10-CM | POA: Diagnosis not present

## 2018-05-26 DIAGNOSIS — N952 Postmenopausal atrophic vaginitis: Secondary | ICD-10-CM | POA: Diagnosis not present

## 2018-05-26 DIAGNOSIS — Z800 Family history of malignant neoplasm of digestive organs: Secondary | ICD-10-CM | POA: Diagnosis not present

## 2018-05-26 DIAGNOSIS — Z803 Family history of malignant neoplasm of breast: Secondary | ICD-10-CM | POA: Diagnosis not present

## 2018-05-26 DIAGNOSIS — Z23 Encounter for immunization: Secondary | ICD-10-CM | POA: Diagnosis not present

## 2018-05-26 DIAGNOSIS — Z6821 Body mass index (BMI) 21.0-21.9, adult: Secondary | ICD-10-CM | POA: Diagnosis not present

## 2018-05-26 DIAGNOSIS — F419 Anxiety disorder, unspecified: Secondary | ICD-10-CM | POA: Diagnosis not present

## 2018-05-26 DIAGNOSIS — Z01419 Encounter for gynecological examination (general) (routine) without abnormal findings: Secondary | ICD-10-CM | POA: Diagnosis not present

## 2018-07-29 DIAGNOSIS — G5601 Carpal tunnel syndrome, right upper limb: Secondary | ICD-10-CM | POA: Diagnosis not present

## 2018-08-09 DIAGNOSIS — Z13820 Encounter for screening for osteoporosis: Secondary | ICD-10-CM | POA: Diagnosis not present

## 2018-08-09 DIAGNOSIS — Z9189 Other specified personal risk factors, not elsewhere classified: Secondary | ICD-10-CM | POA: Diagnosis not present

## 2018-08-09 DIAGNOSIS — M8580 Other specified disorders of bone density and structure, unspecified site: Secondary | ICD-10-CM | POA: Diagnosis not present

## 2018-08-09 DIAGNOSIS — Z803 Family history of malignant neoplasm of breast: Secondary | ICD-10-CM | POA: Diagnosis not present

## 2018-08-09 DIAGNOSIS — Z13220 Encounter for screening for lipoid disorders: Secondary | ICD-10-CM | POA: Diagnosis not present

## 2018-08-09 DIAGNOSIS — Z13228 Encounter for screening for other metabolic disorders: Secondary | ICD-10-CM | POA: Diagnosis not present

## 2018-08-09 DIAGNOSIS — Z1329 Encounter for screening for other suspected endocrine disorder: Secondary | ICD-10-CM | POA: Diagnosis not present

## 2018-08-17 IMAGING — CT CT ABD-PELV W/ CM
2 of 5 series · 15 of 46 positions shown, 17 images · IV contrast (ISOVUE)
Comparison: Abdominal ultrasound from 07/17/2008

CLINICAL DATA: Abdominal pain with nausea and vomiting.

EXAM:
CT ABDOMEN AND PELVIS WITH CONTRAST
TECHNIQUE: Multidetector CT imaging of the abdomen and pelvis was performed
using the standard protocol following bolus administration of
intravenous contrast.
CONTRAST:  100mL JDP9B5-KMM IOPAMIDOL (JDP9B5-KMM) INJECTION 61%

[Series 2: abd/pel with · axial · 0.72mm/px · z∈[-493,-73]mm · 12 of 100 slices shown, 14 images]
[im 8/100  soft-tissue]
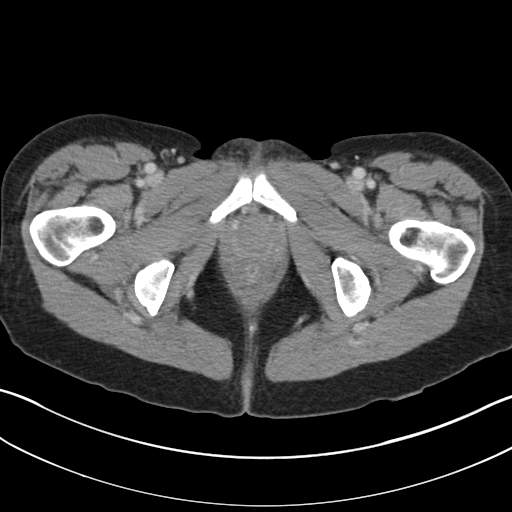
[im 8/100  bone]
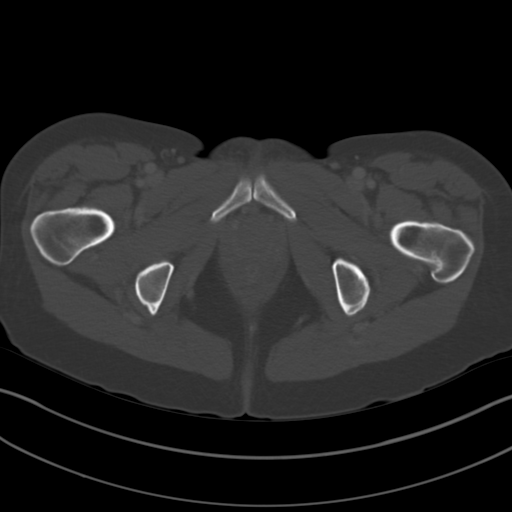
[im 15/100  soft-tissue]
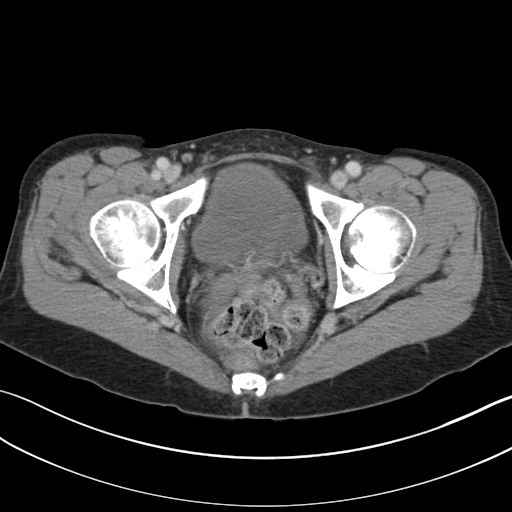
[im 22/100  soft-tissue]
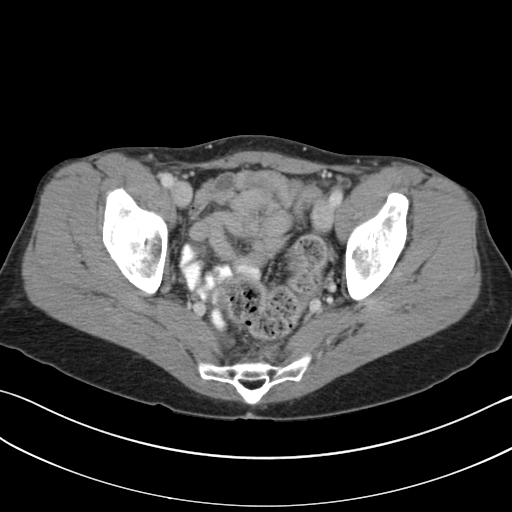
[im 29/100  soft-tissue]
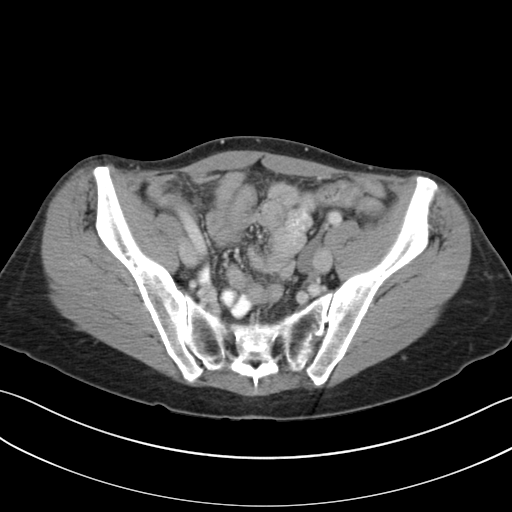
[im 36/100  soft-tissue]
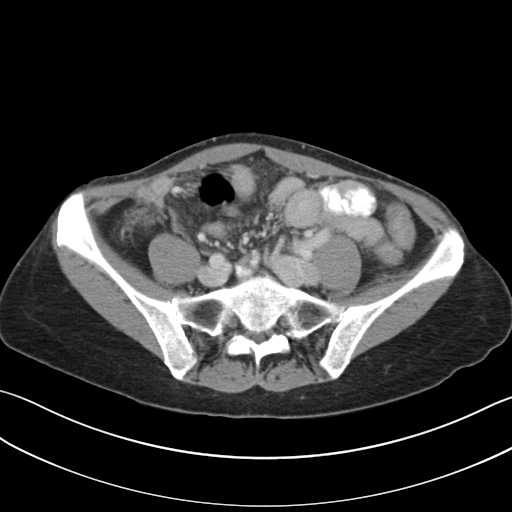
[im 43/100  soft-tissue]
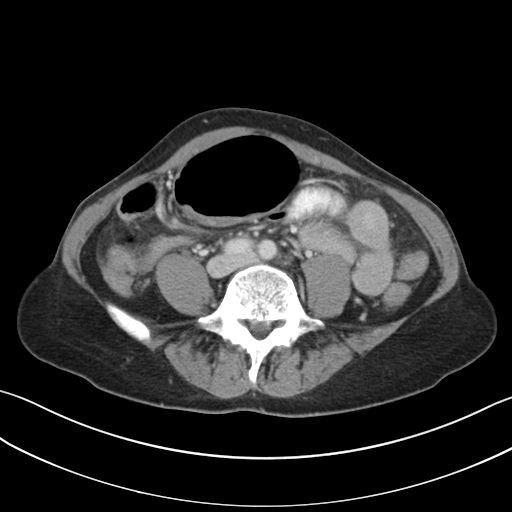
[im 57/100  soft-tissue]
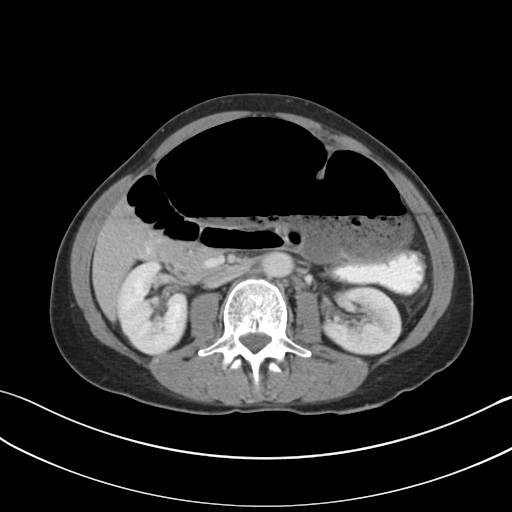
[im 64/100  soft-tissue]
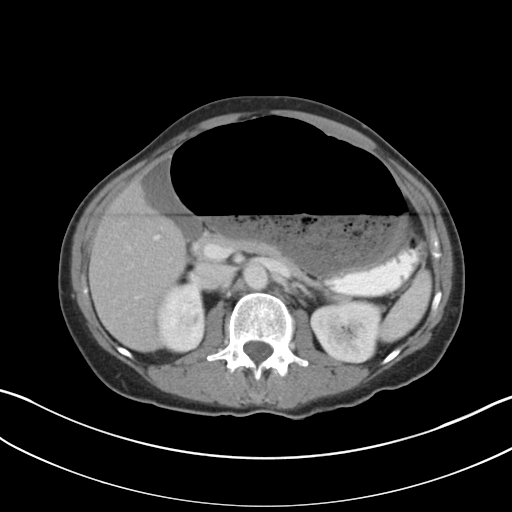
[im 71/100  soft-tissue]
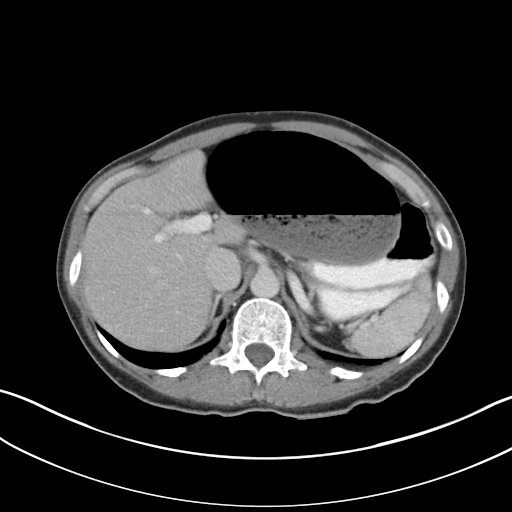
[im 71/100  bone]
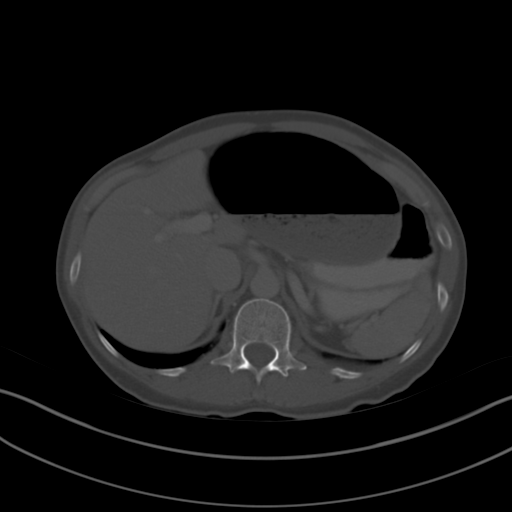
[im 78/100  soft-tissue]
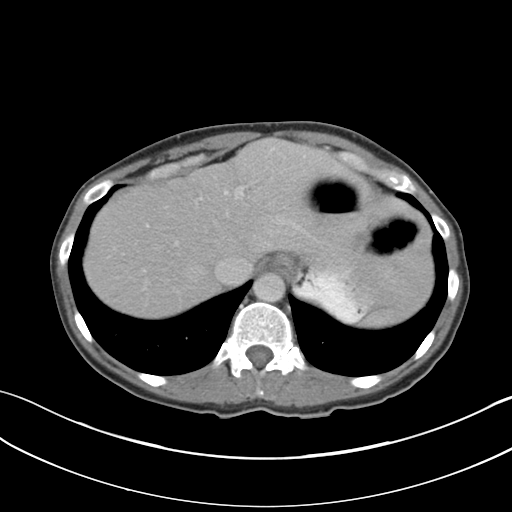
[im 85/100  soft-tissue]
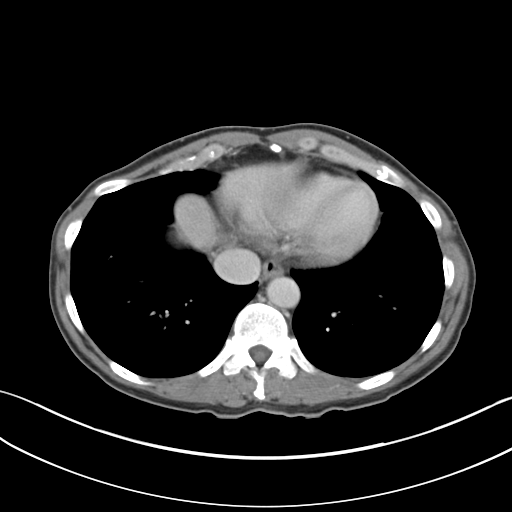
[im 92/100  soft-tissue]
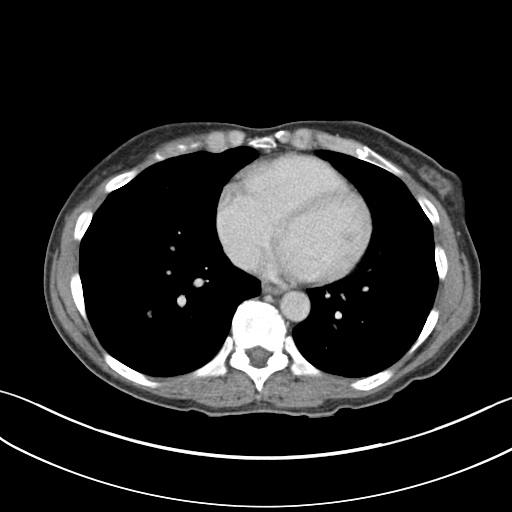

[Series 5: coronal a/|p · coronal · 0.74mm/px · 3 of 115 slices shown]
[im 39/115  soft-tissue]
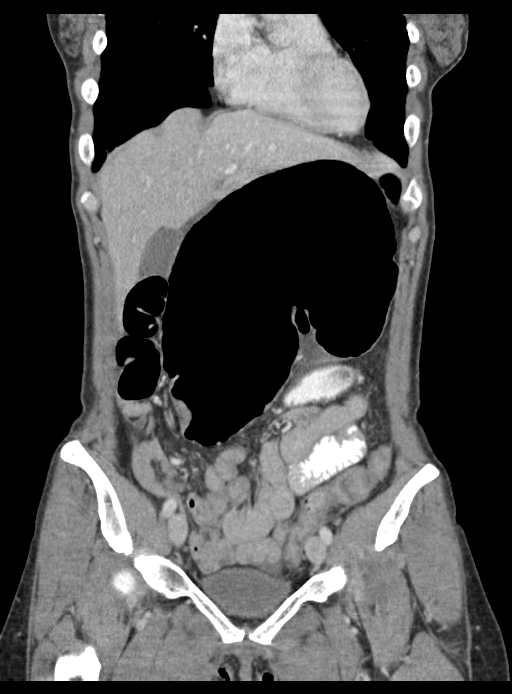
[im 51/115  soft-tissue]
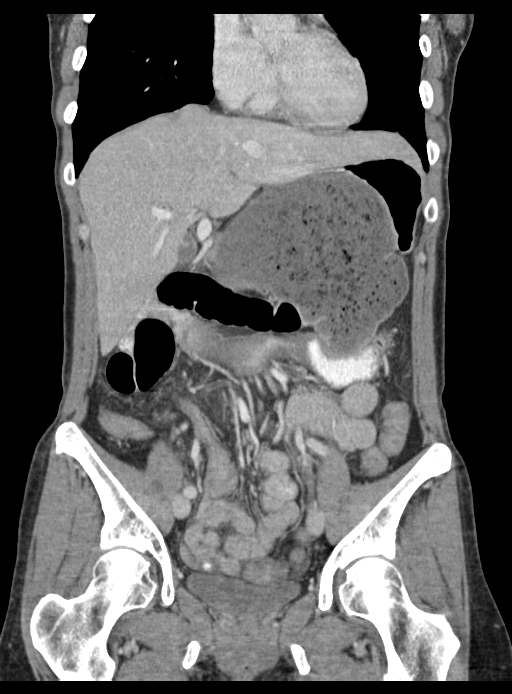
[im 64/115  soft-tissue]
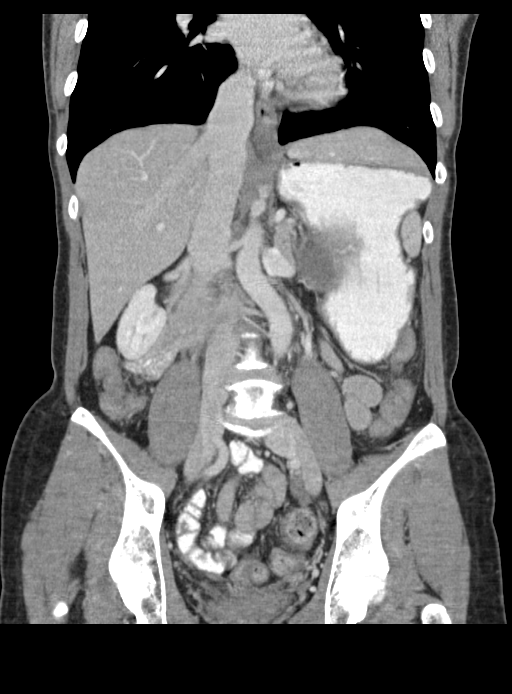

[15 of 46 positions shown; findings below may reference images not displayed]

FINDINGS: Lower chest: Mild reticulonodular opacity in the right lower lobe on
image [DATE] likely from mild atypical infectious bronchiolitis.

Hepatobiliary: Unremarkable

Pancreas: Unremarkable

Spleen: Unremarkable

Adrenals/Urinary Tract: 6 mm hypodense lesion in the right kidney
upper pole, technically nonspecific although statistically likely to
be a cyst. Otherwise unremarkable.

Stomach/Bowel: Cecal volvulus extending up into the left upper
quadrant, luminal diameter 9.4 cm with a large air-fluid level.
Gas-filled appendix extends posterior to the cecum, there is a small
appendicolith along the tip. Total distended segment about 25.0 by
16.8 cm on the scanogram. The colon just distal to the cecal
volvulus is twisted and nondistended.

Vascular/Lymphatic: No adenopathy. No significant degree of
atherosclerosis.

Reproductive: Uterus absent.  Adnexa unremarkable.

Other: Trace free pelvic fluid. Slight stranding along the right
paracolic gutter and omentum.

Musculoskeletal: Lumbar spondylosis and degenerative disc disease
with loss of disc height most notable at L2-3 and L5-S1, and with
impingement at all lumbar levels between L2 and S1.
IMPRESSION: 1. Cecal volvulus with obstruction. Trace free pelvic fluid minimal
fluid stranding along the right paracolic gutter and omentum.
2. Faint reticulonodular opacity in the right lower lobe compatible
with mild atypical infectious bronchiolitis.
3. Lumbar spondylosis and degenerative disc disease, contributing to
impingement at all levels between L2 and S1.
These results were called by telephone at the time of interpretation
on 05/01/2016 at [DATE] to Dr. ALBERTH RIV , who verbally
acknowledged these results.

## 2018-08-17 IMAGING — DX DG CHEST 1V PORT
2 series · 2 of 2 positions shown · non-contrast
Comparison: Portable exam 6971 hours compared to 11/06/2003

CLINICAL DATA: Abdominal pain, nausea and vomiting, constipation an
enema yesterday, awoke this morning with mid to RIGHT lower quadrant
pain and RIGHT back pain

EXAM:
PORTABLE CHEST 1 VIEW

[chest ap (1 of 2)]
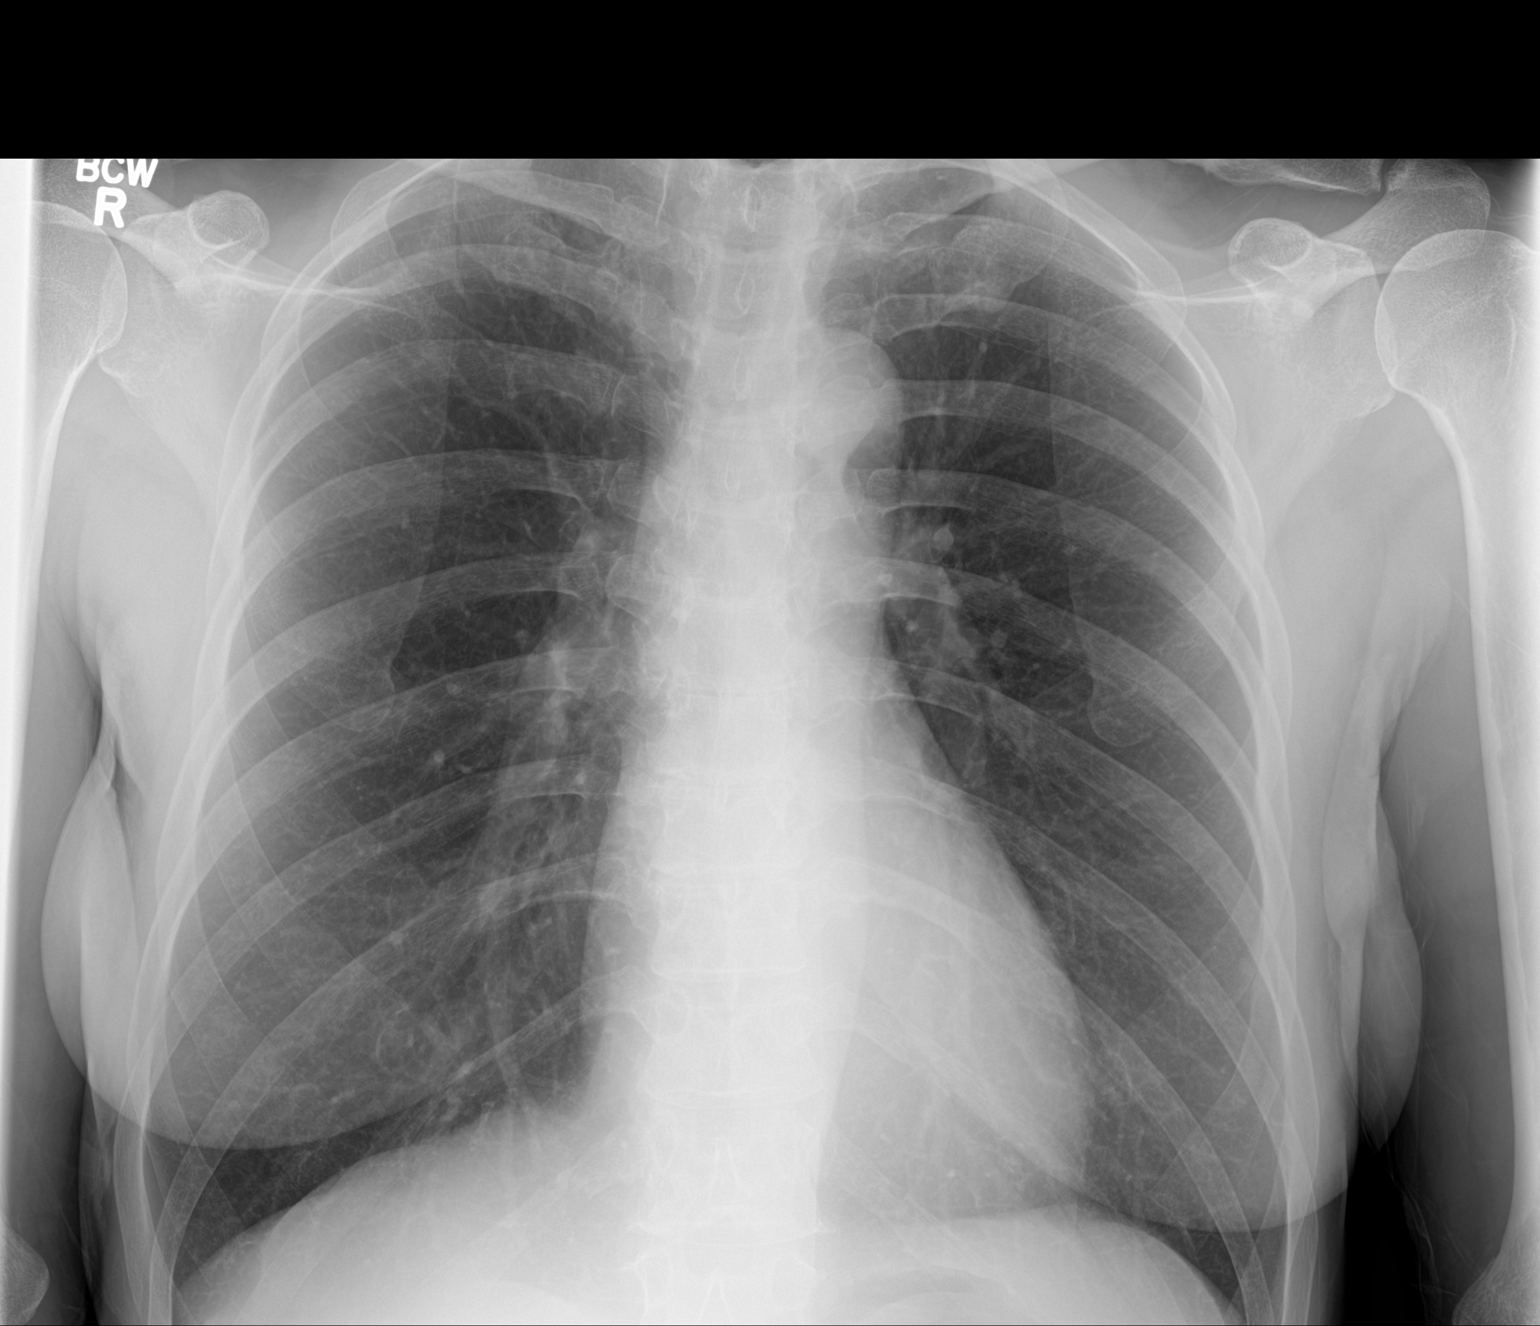

[chest ap (2 of 2)]
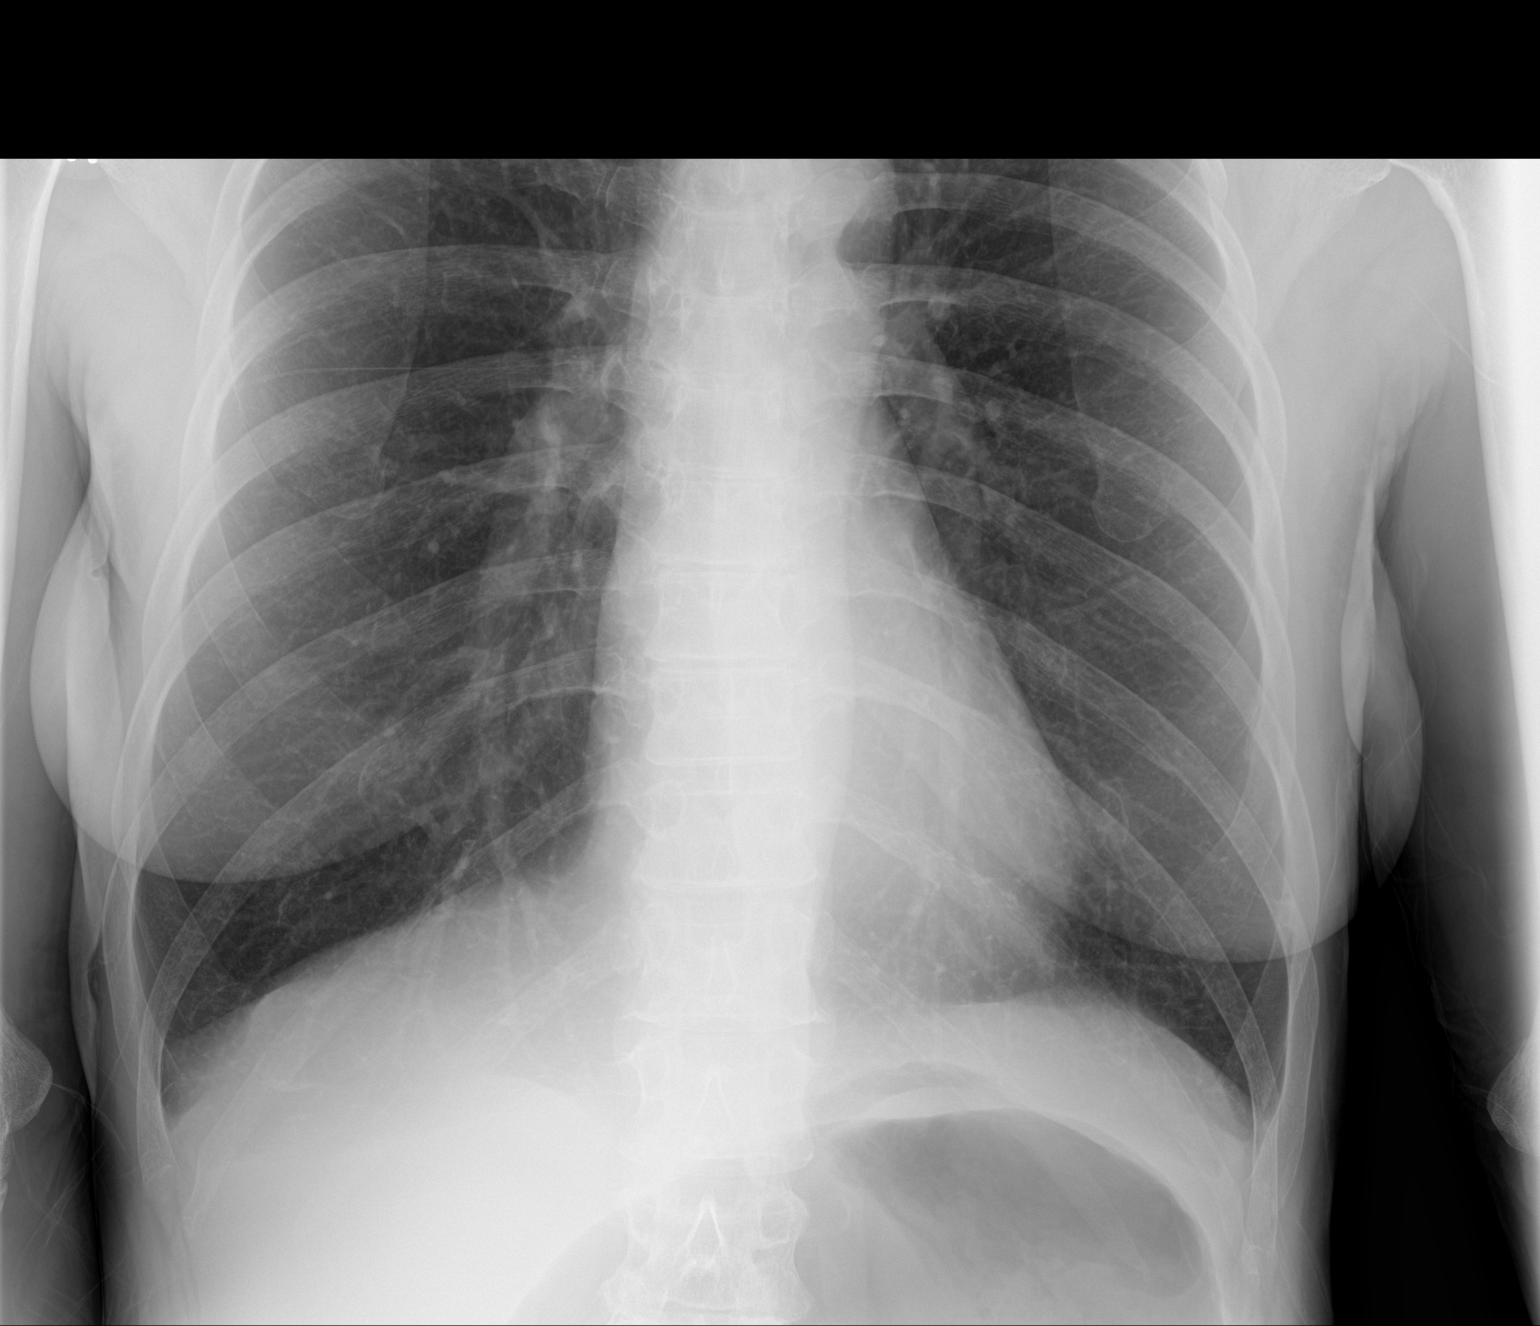

[2 of 2 positions shown; findings below may reference images not displayed]

FINDINGS: Normal heart size, mediastinal contours, and pulmonary vascularity.

Lungs clear.

No pleural effusion or pneumothorax.

Bones unremarkable.
IMPRESSION: No acute abnormalities.

## 2018-12-28 DIAGNOSIS — M9903 Segmental and somatic dysfunction of lumbar region: Secondary | ICD-10-CM | POA: Diagnosis not present

## 2018-12-28 DIAGNOSIS — M545 Low back pain: Secondary | ICD-10-CM | POA: Diagnosis not present

## 2018-12-28 DIAGNOSIS — M9905 Segmental and somatic dysfunction of pelvic region: Secondary | ICD-10-CM | POA: Diagnosis not present

## 2018-12-28 DIAGNOSIS — Q72812 Congenital shortening of left lower limb: Secondary | ICD-10-CM | POA: Diagnosis not present

## 2018-12-29 DIAGNOSIS — Q72812 Congenital shortening of left lower limb: Secondary | ICD-10-CM | POA: Diagnosis not present

## 2018-12-29 DIAGNOSIS — M9903 Segmental and somatic dysfunction of lumbar region: Secondary | ICD-10-CM | POA: Diagnosis not present

## 2018-12-29 DIAGNOSIS — M9905 Segmental and somatic dysfunction of pelvic region: Secondary | ICD-10-CM | POA: Diagnosis not present

## 2018-12-29 DIAGNOSIS — M545 Low back pain: Secondary | ICD-10-CM | POA: Diagnosis not present

## 2019-01-03 DIAGNOSIS — Q72812 Congenital shortening of left lower limb: Secondary | ICD-10-CM | POA: Diagnosis not present

## 2019-01-03 DIAGNOSIS — M9903 Segmental and somatic dysfunction of lumbar region: Secondary | ICD-10-CM | POA: Diagnosis not present

## 2019-01-03 DIAGNOSIS — M545 Low back pain: Secondary | ICD-10-CM | POA: Diagnosis not present

## 2019-01-03 DIAGNOSIS — M9905 Segmental and somatic dysfunction of pelvic region: Secondary | ICD-10-CM | POA: Diagnosis not present

## 2019-01-12 DIAGNOSIS — M9903 Segmental and somatic dysfunction of lumbar region: Secondary | ICD-10-CM | POA: Diagnosis not present

## 2019-01-12 DIAGNOSIS — M9905 Segmental and somatic dysfunction of pelvic region: Secondary | ICD-10-CM | POA: Diagnosis not present

## 2019-01-12 DIAGNOSIS — Q72812 Congenital shortening of left lower limb: Secondary | ICD-10-CM | POA: Diagnosis not present

## 2019-01-12 DIAGNOSIS — M545 Low back pain: Secondary | ICD-10-CM | POA: Diagnosis not present

## 2019-02-10 DIAGNOSIS — M9905 Segmental and somatic dysfunction of pelvic region: Secondary | ICD-10-CM | POA: Diagnosis not present

## 2019-02-10 DIAGNOSIS — M545 Low back pain: Secondary | ICD-10-CM | POA: Diagnosis not present

## 2019-02-10 DIAGNOSIS — M9903 Segmental and somatic dysfunction of lumbar region: Secondary | ICD-10-CM | POA: Diagnosis not present

## 2019-02-10 DIAGNOSIS — Q72812 Congenital shortening of left lower limb: Secondary | ICD-10-CM | POA: Diagnosis not present

## 2019-02-15 DIAGNOSIS — M9905 Segmental and somatic dysfunction of pelvic region: Secondary | ICD-10-CM | POA: Diagnosis not present

## 2019-02-15 DIAGNOSIS — M9903 Segmental and somatic dysfunction of lumbar region: Secondary | ICD-10-CM | POA: Diagnosis not present

## 2019-02-15 DIAGNOSIS — Q72812 Congenital shortening of left lower limb: Secondary | ICD-10-CM | POA: Diagnosis not present

## 2019-02-15 DIAGNOSIS — M545 Low back pain: Secondary | ICD-10-CM | POA: Diagnosis not present

## 2019-02-22 DIAGNOSIS — M9903 Segmental and somatic dysfunction of lumbar region: Secondary | ICD-10-CM | POA: Diagnosis not present

## 2019-02-22 DIAGNOSIS — Q72812 Congenital shortening of left lower limb: Secondary | ICD-10-CM | POA: Diagnosis not present

## 2019-02-22 DIAGNOSIS — M545 Low back pain: Secondary | ICD-10-CM | POA: Diagnosis not present

## 2019-02-22 DIAGNOSIS — M9905 Segmental and somatic dysfunction of pelvic region: Secondary | ICD-10-CM | POA: Diagnosis not present

## 2019-02-24 DIAGNOSIS — M9903 Segmental and somatic dysfunction of lumbar region: Secondary | ICD-10-CM | POA: Diagnosis not present

## 2019-02-24 DIAGNOSIS — M545 Low back pain: Secondary | ICD-10-CM | POA: Diagnosis not present

## 2019-02-24 DIAGNOSIS — Q72812 Congenital shortening of left lower limb: Secondary | ICD-10-CM | POA: Diagnosis not present

## 2019-02-24 DIAGNOSIS — M9905 Segmental and somatic dysfunction of pelvic region: Secondary | ICD-10-CM | POA: Diagnosis not present

## 2019-03-01 DIAGNOSIS — M9903 Segmental and somatic dysfunction of lumbar region: Secondary | ICD-10-CM | POA: Diagnosis not present

## 2019-03-01 DIAGNOSIS — Q72812 Congenital shortening of left lower limb: Secondary | ICD-10-CM | POA: Diagnosis not present

## 2019-03-01 DIAGNOSIS — M9905 Segmental and somatic dysfunction of pelvic region: Secondary | ICD-10-CM | POA: Diagnosis not present

## 2019-03-01 DIAGNOSIS — M545 Low back pain: Secondary | ICD-10-CM | POA: Diagnosis not present

## 2019-03-21 DIAGNOSIS — R0781 Pleurodynia: Secondary | ICD-10-CM | POA: Diagnosis not present

## 2019-03-21 DIAGNOSIS — Z23 Encounter for immunization: Secondary | ICD-10-CM | POA: Diagnosis not present

## 2019-03-21 DIAGNOSIS — E7800 Pure hypercholesterolemia, unspecified: Secondary | ICD-10-CM | POA: Diagnosis not present

## 2019-03-21 DIAGNOSIS — Z8249 Family history of ischemic heart disease and other diseases of the circulatory system: Secondary | ICD-10-CM | POA: Diagnosis not present

## 2019-03-21 DIAGNOSIS — R109 Unspecified abdominal pain: Secondary | ICD-10-CM | POA: Diagnosis not present

## 2019-03-23 ENCOUNTER — Ambulatory Visit
Admission: RE | Admit: 2019-03-23 | Discharge: 2019-03-23 | Disposition: A | Discharge status: Home / Self Care | Payer: BC Managed Care – PPO | Source: Ambulatory Visit | Attending: Family Medicine | Admitting: Family Medicine

## 2019-03-23 ENCOUNTER — Other Ambulatory Visit: Payer: Self-pay

## 2019-03-23 ENCOUNTER — Other Ambulatory Visit: Payer: Self-pay | Admitting: Family Medicine

## 2019-03-23 DIAGNOSIS — R0781 Pleurodynia: Secondary | ICD-10-CM | POA: Diagnosis not present

## 2019-05-12 DIAGNOSIS — M5136 Other intervertebral disc degeneration, lumbar region: Secondary | ICD-10-CM | POA: Diagnosis not present

## 2019-05-31 DIAGNOSIS — Z1231 Encounter for screening mammogram for malignant neoplasm of breast: Secondary | ICD-10-CM | POA: Diagnosis not present

## 2019-05-31 DIAGNOSIS — Z01419 Encounter for gynecological examination (general) (routine) without abnormal findings: Secondary | ICD-10-CM | POA: Diagnosis not present

## 2019-05-31 DIAGNOSIS — Z6821 Body mass index (BMI) 21.0-21.9, adult: Secondary | ICD-10-CM | POA: Diagnosis not present

## 2019-08-12 DIAGNOSIS — Z1159 Encounter for screening for other viral diseases: Secondary | ICD-10-CM | POA: Diagnosis not present

## 2019-08-17 DIAGNOSIS — Z980 Intestinal bypass and anastomosis status: Secondary | ICD-10-CM | POA: Diagnosis not present

## 2019-08-17 DIAGNOSIS — K640 First degree hemorrhoids: Secondary | ICD-10-CM | POA: Diagnosis not present

## 2019-08-17 DIAGNOSIS — Z1211 Encounter for screening for malignant neoplasm of colon: Secondary | ICD-10-CM | POA: Diagnosis not present

## 2020-06-27 ENCOUNTER — Other Ambulatory Visit: Payer: Self-pay | Admitting: Obstetrics and Gynecology

## 2020-06-27 DIAGNOSIS — Z9189 Other specified personal risk factors, not elsewhere classified: Secondary | ICD-10-CM

## 2020-06-28 ENCOUNTER — Other Ambulatory Visit: Payer: Self-pay | Admitting: Obstetrics and Gynecology

## 2020-06-28 DIAGNOSIS — Z8249 Family history of ischemic heart disease and other diseases of the circulatory system: Secondary | ICD-10-CM

## 2021-05-21 ENCOUNTER — Other Ambulatory Visit: Payer: Self-pay | Admitting: Obstetrics and Gynecology

## 2021-05-21 DIAGNOSIS — Z1231 Encounter for screening mammogram for malignant neoplasm of breast: Secondary | ICD-10-CM

## 2021-06-27 ENCOUNTER — Ambulatory Visit (INDEPENDENT_AMBULATORY_CARE_PROVIDER_SITE_OTHER): Payer: 59

## 2021-06-27 ENCOUNTER — Other Ambulatory Visit: Payer: Self-pay

## 2021-06-27 DIAGNOSIS — Z1231 Encounter for screening mammogram for malignant neoplasm of breast: Secondary | ICD-10-CM

## 2021-12-24 ENCOUNTER — Other Ambulatory Visit: Payer: Self-pay | Admitting: Physician Assistant

## 2021-12-24 DIAGNOSIS — R140 Abdominal distension (gaseous): Secondary | ICD-10-CM

## 2021-12-24 DIAGNOSIS — K5900 Constipation, unspecified: Secondary | ICD-10-CM

## 2021-12-24 DIAGNOSIS — R109 Unspecified abdominal pain: Secondary | ICD-10-CM

## 2021-12-27 ENCOUNTER — Ambulatory Visit
Admission: RE | Admit: 2021-12-27 | Discharge: 2021-12-27 | Disposition: A | Discharge status: Home / Self Care | Payer: Commercial Managed Care - HMO | Source: Ambulatory Visit | Attending: Physician Assistant | Admitting: Physician Assistant

## 2021-12-27 DIAGNOSIS — R109 Unspecified abdominal pain: Secondary | ICD-10-CM

## 2021-12-27 DIAGNOSIS — K5900 Constipation, unspecified: Secondary | ICD-10-CM

## 2021-12-27 DIAGNOSIS — R140 Abdominal distension (gaseous): Secondary | ICD-10-CM

## 2021-12-27 MED ORDER — IOPAMIDOL (ISOVUE-300) INJECTION 61%
100.0000 mL | Freq: Once | INTRAVENOUS | Status: AC | PRN
  Administered 2021-12-27: 100 mL via INTRAVENOUS

## 2022-07-04 ENCOUNTER — Other Ambulatory Visit: Payer: Self-pay | Admitting: Obstetrics and Gynecology

## 2022-07-04 DIAGNOSIS — Z1231 Encounter for screening mammogram for malignant neoplasm of breast: Secondary | ICD-10-CM

## 2022-08-06 ENCOUNTER — Ambulatory Visit (INDEPENDENT_AMBULATORY_CARE_PROVIDER_SITE_OTHER): Payer: Commercial Managed Care - HMO

## 2022-08-06 DIAGNOSIS — Z1231 Encounter for screening mammogram for malignant neoplasm of breast: Secondary | ICD-10-CM | POA: Diagnosis not present

## 2022-08-11 ENCOUNTER — Other Ambulatory Visit: Payer: Self-pay | Admitting: Obstetrics and Gynecology

## 2022-08-11 DIAGNOSIS — R928 Other abnormal and inconclusive findings on diagnostic imaging of breast: Secondary | ICD-10-CM

## 2022-08-21 ENCOUNTER — Ambulatory Visit
Admission: RE | Admit: 2022-08-21 | Discharge: 2022-08-21 | Disposition: A | Discharge status: Home / Self Care | Payer: Commercial Managed Care - HMO | Source: Ambulatory Visit | Attending: Obstetrics and Gynecology | Admitting: Obstetrics and Gynecology

## 2022-08-21 ENCOUNTER — Other Ambulatory Visit: Payer: Self-pay | Admitting: Obstetrics and Gynecology

## 2022-08-21 DIAGNOSIS — R928 Other abnormal and inconclusive findings on diagnostic imaging of breast: Secondary | ICD-10-CM

## 2022-08-21 DIAGNOSIS — R921 Mammographic calcification found on diagnostic imaging of breast: Secondary | ICD-10-CM

## 2022-08-25 ENCOUNTER — Ambulatory Visit
Admission: RE | Admit: 2022-08-25 | Discharge: 2022-08-25 | Disposition: A | Discharge status: Home / Self Care | Payer: Commercial Managed Care - HMO | Source: Ambulatory Visit | Attending: Obstetrics and Gynecology | Admitting: Obstetrics and Gynecology

## 2022-08-25 DIAGNOSIS — R921 Mammographic calcification found on diagnostic imaging of breast: Secondary | ICD-10-CM

## 2022-08-25 HISTORY — PX: BREAST BIOPSY: SHX20

## 2022-08-28 ENCOUNTER — Ambulatory Visit: Payer: Self-pay | Admitting: General Surgery

## 2022-08-28 DIAGNOSIS — R921 Mammographic calcification found on diagnostic imaging of breast: Secondary | ICD-10-CM

## 2022-08-28 MED ORDER — KETOROLAC TROMETHAMINE 15 MG/ML IJ SOLN
15.0000 mg | Freq: Once | INTRAMUSCULAR | Status: AC
Start: 2022-08-28 — End: 2022-09-02

## 2022-09-10 ENCOUNTER — Other Ambulatory Visit: Payer: Self-pay | Admitting: General Surgery

## 2022-09-10 DIAGNOSIS — R921 Mammographic calcification found on diagnostic imaging of breast: Secondary | ICD-10-CM

## 2022-10-13 ENCOUNTER — Other Ambulatory Visit (HOSPITAL_COMMUNITY): Payer: Self-pay | Admitting: Family Medicine

## 2022-10-13 DIAGNOSIS — Z8249 Family history of ischemic heart disease and other diseases of the circulatory system: Secondary | ICD-10-CM

## 2022-11-18 ENCOUNTER — Encounter (HOSPITAL_BASED_OUTPATIENT_CLINIC_OR_DEPARTMENT_OTHER): Payer: Self-pay | Admitting: General Surgery

## 2022-11-21 ENCOUNTER — Ambulatory Visit
Admission: RE | Admit: 2022-11-21 | Discharge: 2022-11-21 | Disposition: A | Discharge status: Home / Self Care | Payer: Medicare HMO | Source: Ambulatory Visit | Attending: General Surgery | Admitting: General Surgery

## 2022-11-21 DIAGNOSIS — R921 Mammographic calcification found on diagnostic imaging of breast: Secondary | ICD-10-CM

## 2022-11-21 HISTORY — PX: BREAST BIOPSY: SHX20

## 2022-11-21 NOTE — Progress Notes (Signed)
Gave patient CHG soap with instructions, patient verbalized understanding. Gave Ensure for pt to drink by [employer].

## 2022-11-23 ENCOUNTER — Encounter (HOSPITAL_BASED_OUTPATIENT_CLINIC_OR_DEPARTMENT_OTHER): Payer: Self-pay | Admitting: General Surgery

## 2022-11-23 NOTE — Anesthesia Preprocedure Evaluation (Signed)
Anesthesia Evaluation  Patient identified by MRN, date of birth, ID band Patient awake    Reviewed: Allergy & Precautions, NPO status , Patient's Chart, lab work & pertinent test results  History of Anesthesia Complications (+) PONV and history of anesthetic complications  Airway Mallampati: I  TM Distance: >3 FB Neck ROM: Full    Dental  (+) Teeth Intact, Dental Advisory Given   Pulmonary    Pulmonary exam normal breath sounds clear to auscultation       Cardiovascular negative cardio ROS Normal cardiovascular exam Rhythm:Regular Rate:Normal     Neuro/Psych   Anxiety     negative neurological ROS     GI/Hepatic negative GI ROS, Neg liver ROS,,,  Endo/Other  Left breast Calcification  Renal/GU negative Renal ROS  negative genitourinary   Musculoskeletal negative musculoskeletal ROS (+)    Abdominal   Peds  Hematology negative hematology ROS (+)   Anesthesia Other Findings   Reproductive/Obstetrics                              Anesthesia Physical Anesthesia Plan  ASA: 2  Anesthesia Plan: General   Post-op Pain Management: Minimal or no pain anticipated, Tylenol PO (pre-op)* and Precedex   Induction: Intravenous  PONV Risk Score and Plan: 4 or greater and Treatment may vary due to age or medical condition, Scopolamine patch - Pre-op, Diphenhydramine, Midazolam, Dexamethasone and Ondansetron  Airway Management Planned: LMA  Additional Equipment: None  Intra-op Plan:   Post-operative Plan: Extubation in OR  Informed Consent: I have reviewed the patients History and Physical, chart, labs and discussed the procedure including the risks, benefits and alternatives for the proposed anesthesia with the patient or authorized representative who has indicated his/her understanding and acceptance.     Dental advisory given  Plan Discussed with: CRNA and  Anesthesiologist  Anesthesia Plan Comments:         Anesthesia Quick Evaluation

## 2022-11-24 ENCOUNTER — Other Ambulatory Visit: Payer: Self-pay

## 2022-11-24 ENCOUNTER — Ambulatory Visit: Payer: Commercial Managed Care - HMO

## 2022-11-24 ENCOUNTER — Encounter (HOSPITAL_BASED_OUTPATIENT_CLINIC_OR_DEPARTMENT_OTHER)
Admission: RE | Disposition: A | Discharge status: Home / Self Care | Payer: Self-pay | Source: Home / Self Care | Attending: General Surgery

## 2022-11-24 ENCOUNTER — Ambulatory Visit
Admission: RE | Admit: 2022-11-24 | Discharge: 2022-11-24 | Disposition: A | Discharge status: Home / Self Care | Payer: Commercial Managed Care - HMO | Source: Ambulatory Visit | Attending: General Surgery | Admitting: General Surgery

## 2022-11-24 ENCOUNTER — Encounter (HOSPITAL_BASED_OUTPATIENT_CLINIC_OR_DEPARTMENT_OTHER): Payer: Self-pay | Admitting: General Surgery

## 2022-11-24 ENCOUNTER — Ambulatory Visit (HOSPITAL_BASED_OUTPATIENT_CLINIC_OR_DEPARTMENT_OTHER)
Admission: RE | Admit: 2022-11-24 | Discharge: 2022-11-24 | Disposition: A | Discharge status: Home / Self Care | Payer: Medicare HMO | Attending: General Surgery | Admitting: General Surgery

## 2022-11-24 ENCOUNTER — Ambulatory Visit (HOSPITAL_BASED_OUTPATIENT_CLINIC_OR_DEPARTMENT_OTHER): Payer: Medicare HMO | Admitting: Anesthesiology

## 2022-11-24 DIAGNOSIS — N6092 Unspecified benign mammary dysplasia of left breast: Secondary | ICD-10-CM | POA: Diagnosis not present

## 2022-11-24 DIAGNOSIS — F419 Anxiety disorder, unspecified: Secondary | ICD-10-CM | POA: Diagnosis not present

## 2022-11-24 DIAGNOSIS — R921 Mammographic calcification found on diagnostic imaging of breast: Secondary | ICD-10-CM

## 2022-11-24 DIAGNOSIS — Z803 Family history of malignant neoplasm of breast: Secondary | ICD-10-CM | POA: Insufficient documentation

## 2022-11-24 DIAGNOSIS — N6489 Other specified disorders of breast: Secondary | ICD-10-CM | POA: Insufficient documentation

## 2022-11-24 DIAGNOSIS — N6012 Diffuse cystic mastopathy of left breast: Secondary | ICD-10-CM | POA: Diagnosis not present

## 2022-11-24 DIAGNOSIS — Z01818 Encounter for other preprocedural examination: Secondary | ICD-10-CM

## 2022-11-24 DIAGNOSIS — N62 Hypertrophy of breast: Secondary | ICD-10-CM | POA: Diagnosis not present

## 2022-11-24 HISTORY — PX: BREAST EXCISIONAL BIOPSY: SUR124

## 2022-11-24 HISTORY — DX: Other specified postprocedural states: Z98.890

## 2022-11-24 HISTORY — PX: BREAST LUMPECTOMY WITH RADIOACTIVE SEED LOCALIZATION: SHX6424

## 2022-11-24 SURGERY — BREAST LUMPECTOMY WITH RADIOACTIVE SEED LOCALIZATION
Anesthesia: General | Site: Breast | Laterality: Left

## 2022-11-24 MED ORDER — FENTANYL CITRATE (PF) 100 MCG/2ML IJ SOLN
INTRAMUSCULAR | Status: DC | PRN
  Administered 2022-11-24 (×2): 50 ug via INTRAVENOUS

## 2022-11-24 MED ORDER — PROPOFOL 10 MG/ML IV BOLUS
INTRAVENOUS | Status: DC | PRN
Start: 2022-11-24 — End: 2022-11-24
  Administered 2022-11-24: 150 mg via INTRAVENOUS

## 2022-11-24 MED ORDER — CEFAZOLIN SODIUM-DEXTROSE 2-3 GM-%(50ML) IV SOLR
INTRAVENOUS | Status: DC | PRN
  Administered 2022-11-24: 2 g via INTRAVENOUS

## 2022-11-24 MED ORDER — ONDANSETRON HCL 4 MG/2ML IJ SOLN
INTRAMUSCULAR | Status: DC | PRN
  Administered 2022-11-24: 4 mg via INTRAVENOUS

## 2022-11-24 MED ORDER — SCOPOLAMINE 1 MG/3DAYS TD PT72: 1.0000 | MEDICATED_PATCH | TRANSDERMAL | Status: DC

## 2022-11-24 MED ORDER — LIDOCAINE 2% (20 MG/ML) 5 ML SYRINGE
INTRAMUSCULAR | Status: AC
  Filled 2022-11-24: qty 5

## 2022-11-24 MED ORDER — BUPIVACAINE-EPINEPHRINE (PF) 0.25% -1:200000 IJ SOLN
INTRAMUSCULAR | Status: DC | PRN
  Administered 2022-11-24: 20 mL

## 2022-11-24 MED ORDER — ONDANSETRON HCL 4 MG/2ML IJ SOLN: 4.0000 mg | Freq: Once | INTRAMUSCULAR | Status: DC | PRN

## 2022-11-24 MED ORDER — DEXAMETHASONE SODIUM PHOSPHATE 10 MG/ML IJ SOLN
INTRAMUSCULAR | Status: AC
  Filled 2022-11-24: qty 1

## 2022-11-24 MED ORDER — CEFAZOLIN SODIUM-DEXTROSE 2-4 GM/100ML-% IV SOLN
INTRAVENOUS | Status: AC
  Filled 2022-11-24: qty 100

## 2022-11-24 MED ORDER — LACTATED RINGERS IV SOLN: INTRAVENOUS | Status: DC | PRN

## 2022-11-24 MED ORDER — GABAPENTIN 300 MG PO CAPS
300.0000 mg | ORAL_CAPSULE | ORAL | Status: AC
  Administered 2022-11-24: 300 mg via ORAL

## 2022-11-24 MED ORDER — LACTATED RINGERS IV SOLN: INTRAVENOUS | Status: DC

## 2022-11-24 MED ORDER — SCOPOLAMINE 1 MG/3DAYS TD PT72
MEDICATED_PATCH | TRANSDERMAL | Status: AC
  Filled 2022-11-24: qty 1

## 2022-11-24 MED ORDER — TRAMADOL HCL 50 MG PO TABS: 50.0000 mg | ORAL_TABLET | Freq: Four times a day (QID) | ORAL | 0 refills | Status: AC | PRN

## 2022-11-24 MED ORDER — PROPOFOL 10 MG/ML IV BOLUS
INTRAVENOUS | Status: AC
  Filled 2022-11-24: qty 20

## 2022-11-24 MED ORDER — AMISULPRIDE (ANTIEMETIC) 5 MG/2ML IV SOLN: 10.0000 mg | Freq: Once | INTRAVENOUS | Status: DC | PRN

## 2022-11-24 MED ORDER — OXYCODONE HCL 5 MG/5ML PO SOLN: 5.0000 mg | Freq: Once | ORAL | Status: DC | PRN

## 2022-11-24 MED ORDER — DIPHENHYDRAMINE HCL 50 MG/ML IJ SOLN: 25.0000 mg | Freq: Once | INTRAMUSCULAR | Status: DC

## 2022-11-24 MED ORDER — ONDANSETRON HCL 4 MG/2ML IJ SOLN
INTRAMUSCULAR | Status: AC
  Filled 2022-11-24: qty 2

## 2022-11-24 MED ORDER — SCOPOLAMINE 1 MG/3DAYS TD PT72
1.0000 | MEDICATED_PATCH | TRANSDERMAL | Status: DC
  Administered 2022-11-24: 1.5 mg via TRANSDERMAL

## 2022-11-24 MED ORDER — CHLORHEXIDINE GLUCONATE CLOTH 2 % EX PADS: 6.0000 | MEDICATED_PAD | Freq: Once | CUTANEOUS | Status: DC

## 2022-11-24 MED ORDER — EPHEDRINE SULFATE (PRESSORS) 50 MG/ML IJ SOLN
INTRAMUSCULAR | Status: DC | PRN
  Administered 2022-11-24 (×5): 10 mg via INTRAVENOUS

## 2022-11-24 MED ORDER — FENTANYL CITRATE (PF) 100 MCG/2ML IJ SOLN
INTRAMUSCULAR | Status: AC
  Filled 2022-11-24: qty 2

## 2022-11-24 MED ORDER — GABAPENTIN 300 MG PO CAPS
ORAL_CAPSULE | ORAL | Status: AC
  Filled 2022-11-24: qty 1

## 2022-11-24 MED ORDER — MIDAZOLAM HCL 2 MG/2ML IJ SOLN
INTRAMUSCULAR | Status: DC | PRN
  Administered 2022-11-24: 2 mg via INTRAVENOUS

## 2022-11-24 MED ORDER — CEFAZOLIN SODIUM-DEXTROSE 2-4 GM/100ML-% IV SOLN: 2.0000 g | INTRAVENOUS | Status: DC

## 2022-11-24 MED ORDER — ACETAMINOPHEN 500 MG PO TABS: 1000.0000 mg | ORAL_TABLET | Freq: Once | ORAL | Status: DC

## 2022-11-24 MED ORDER — OXYCODONE HCL 5 MG PO TABS: 5.0000 mg | ORAL_TABLET | Freq: Once | ORAL | Status: DC | PRN

## 2022-11-24 MED ORDER — GLYCOPYRROLATE 0.2 MG/ML IJ SOLN
INTRAMUSCULAR | Status: DC | PRN
  Administered 2022-11-24: .2 mg via INTRAVENOUS

## 2022-11-24 MED ORDER — DEXAMETHASONE SODIUM PHOSPHATE 10 MG/ML IJ SOLN
INTRAMUSCULAR | Status: DC | PRN
  Administered 2022-11-24: 5 mg via INTRAVENOUS

## 2022-11-24 MED ORDER — ACETAMINOPHEN 500 MG PO TABS
ORAL_TABLET | ORAL | Status: AC
  Filled 2022-11-24: qty 2

## 2022-11-24 MED ORDER — BUPIVACAINE-EPINEPHRINE (PF) 0.25% -1:200000 IJ SOLN
INTRAMUSCULAR | Status: AC
  Filled 2022-11-24: qty 90

## 2022-11-24 MED ORDER — FENTANYL CITRATE (PF) 100 MCG/2ML IJ SOLN: 25.0000 ug | INTRAMUSCULAR | Status: DC | PRN

## 2022-11-24 MED ORDER — LIDOCAINE HCL (CARDIAC) PF 100 MG/5ML IV SOSY
PREFILLED_SYRINGE | INTRAVENOUS | Status: DC | PRN
  Administered 2022-11-24: 60 mg via INTRAVENOUS

## 2022-11-24 MED ORDER — ACETAMINOPHEN 500 MG PO TABS
1000.0000 mg | ORAL_TABLET | ORAL | Status: AC
  Administered 2022-11-24: 1000 mg via ORAL

## 2022-11-24 MED ORDER — MIDAZOLAM HCL 2 MG/2ML IJ SOLN
INTRAMUSCULAR | Status: AC
  Filled 2022-11-24: qty 2

## 2022-11-24 SURGICAL SUPPLY — 43 items
ADH SKN CLS APL DERMABOND .7 (GAUZE/BANDAGES/DRESSINGS) ×1
APL PRP STRL LF DISP 70% ISPRP (MISCELLANEOUS) ×1
APPLIER CLIP 9.375 MED OPEN (MISCELLANEOUS)
APR CLP MED 9.3 20 MLT OPN (MISCELLANEOUS)
BINDER BREAST LRG (GAUZE/BANDAGES/DRESSINGS) IMPLANT
BLADE SURG 15 STRL LF DISP TIS (BLADE) ×1 IMPLANT
BLADE SURG 15 STRL SS (BLADE) ×1
CANISTER SUC SOCK COL 7IN (MISCELLANEOUS) ×1 IMPLANT
CANISTER SUCT 1200ML W/VALVE (MISCELLANEOUS) ×1 IMPLANT
CHLORAPREP W/TINT 26 (MISCELLANEOUS) ×1 IMPLANT
CLIP APPLIE 9.375 MED OPEN (MISCELLANEOUS) IMPLANT
COVER BACK TABLE 60X90IN (DRAPES) ×1 IMPLANT
COVER MAYO STAND STRL (DRAPES) ×1 IMPLANT
COVER PROBE CYLINDRICAL 5X96 (MISCELLANEOUS) ×1 IMPLANT
DERMABOND ADVANCED .7 DNX12 (GAUZE/BANDAGES/DRESSINGS) ×1 IMPLANT
DRAPE LAPAROSCOPIC ABDOMINAL (DRAPES) ×1 IMPLANT
DRAPE UTILITY XL STRL (DRAPES) ×1 IMPLANT
ELECT COATED BLADE 2.86 ST (ELECTRODE) ×1 IMPLANT
ELECT REM PT RETURN 9FT ADLT (ELECTROSURGICAL) ×1
ELECTRODE REM PT RTRN 9FT ADLT (ELECTROSURGICAL) ×1 IMPLANT
GLOVE BIO SURGEON STRL SZ7 (GLOVE) IMPLANT
GLOVE BIO SURGEON STRL SZ7.5 (GLOVE) ×2 IMPLANT
GLOVE BIOGEL PI IND STRL 7.0 (GLOVE) IMPLANT
GLOVE BIOGEL PI IND STRL 7.5 (GLOVE) IMPLANT
GOWN STRL REUS W/ TWL LRG LVL3 (GOWN DISPOSABLE) ×2 IMPLANT
GOWN STRL REUS W/TWL LRG LVL3 (GOWN DISPOSABLE) ×2
KIT MARKER MARGIN INK (KITS) ×1 IMPLANT
NDL HYPO 25X1 1.5 SAFETY (NEEDLE) IMPLANT
NEEDLE HYPO 25X1 1.5 SAFETY (NEEDLE) ×1 IMPLANT
NS IRRIG 1000ML POUR BTL (IV SOLUTION) IMPLANT
PACK BASIN DAY SURGERY FS (CUSTOM PROCEDURE TRAY) ×1 IMPLANT
PENCIL SMOKE EVACUATOR (MISCELLANEOUS) ×1 IMPLANT
SLEEVE SCD COMPRESS KNEE MED (STOCKING) ×1 IMPLANT
SPIKE FLUID TRANSFER (MISCELLANEOUS) IMPLANT
SPONGE T-LAP 18X18 ~~LOC~~+RFID (SPONGE) ×1 IMPLANT
SUT MON AB 4-0 PC3 18 (SUTURE) ×1 IMPLANT
SUT SILK 2 0 SH (SUTURE) IMPLANT
SUT VICRYL 3-0 CR8 SH (SUTURE) ×1 IMPLANT
SYR CONTROL 10ML LL (SYRINGE) IMPLANT
TOWEL GREEN STERILE FF (TOWEL DISPOSABLE) ×1 IMPLANT
TRAY FAXITRON CT DISP (TRAY / TRAY PROCEDURE) ×1 IMPLANT
TUBE CONNECTING 20X1/4 (TUBING) ×1 IMPLANT
YANKAUER SUCT BULB TIP NO VENT (SUCTIONS) IMPLANT

## 2022-11-24 NOTE — Anesthesia Procedure Notes (Signed)
Procedure Name: LMA Insertion Date/Time: 11/24/2022 7:43 AM  Performed by: Karen Kitchens, CRNAPre-anesthesia Checklist: Patient identified, Emergency Drugs available, Suction available and Patient being monitored Patient Re-evaluated:Patient Re-evaluated prior to induction Oxygen Delivery Method: Circle system utilized Preoxygenation: Pre-oxygenation with 100% oxygen Induction Type: IV induction Ventilation: Mask ventilation without difficulty LMA: LMA inserted LMA Size: 3.0 Number of attempts: 1 Airway Equipment and Method: Bite block Placement Confirmation: positive ETCO2, CO2 detector and breath sounds checked- equal and bilateral Tube secured with: Tape Dental Injury: Teeth and Oropharynx as per pre-operative assessment

## 2022-11-24 NOTE — Discharge Instructions (Signed)
No Tylenol until after 1:00pm today, if needed.   Post Anesthesia Home Care Instructions  Activity: Get plenty of rest for the remainder of the day. A responsible individual must stay with you for 24 hours following the procedure.  For the next 24 hours, DO NOT: -Drive a car -Advertising copywriter -Drink alcoholic beverages -Take any medication unless instructed by your physician -Make any legal decisions or sign important papers.  Meals: Start with liquid foods such as gelatin or soup. Progress to regular foods as tolerated. Avoid greasy, spicy, heavy foods. If nausea and/or vomiting occur, drink only clear liquids until the nausea and/or vomiting subsides. Call your physician if vomiting continues.  Special Instructions/Symptoms: Your throat may feel dry or sore from the anesthesia or the breathing tube placed in your throat during surgery. If this causes discomfort, gargle with warm salt water. The discomfort should disappear within 24 hours.  If you had a scopolamine patch placed behind your ear for the management of post- operative nausea and/or vomiting:  1. The medication in the patch is effective for 72 hours, after which it should be removed.  Wrap patch in a tissue and discard in the trash. Wash hands thoroughly with soap and water. 2. You may remove the patch earlier than 72 hours if you experience unpleasant side effects which may include dry mouth, dizziness or visual disturbances. 3. Avoid touching the patch. Wash your hands with soap and water after contact with the patch.

## 2022-11-24 NOTE — Op Note (Signed)
11/24/2022  8:19 AM  PATIENT:  Kristy Murillo  67 y.o. female  PRE-OPERATIVE DIAGNOSIS:  LEFT BREAST CALCIFICATION  POST-OPERATIVE DIAGNOSIS:  LEFT BREAST CALCIFICATION  PROCEDURE:  Procedure(s): LEFT BREAST BRACKETED LUMPECTOMY WITH RADIOACTIVE SEED LOCALIZATION (Left)  SURGEON:  Surgeons and Role:    Griselda Miner, MD - Primary  PHYSICIAN ASSISTANT:   ASSISTANTS: none   ANESTHESIA:   local and general  EBL:  15 mL   BLOOD ADMINISTERED:none  DRAINS: none   LOCAL MEDICATIONS USED:  MARCAINE     SPECIMEN:  Source of Specimen:  left breast tissue with additional inferior margin  DISPOSITION OF SPECIMEN:  PATHOLOGY  COUNTS:  YES  TOURNIQUET:  * No tourniquets in log *  DICTATION: .Dragon Dictation  After informed consent was obtained the patient was brought to the operating room and placed in the supine position on the operating table.  After adequate induction of general anesthesia the patient's left breast was prepped with ChloraPrep, allowed to dry, and draped in usual sterile manner.  An appropriate timeout was performed.  Previously 2 I-125 seeds were placed in the central and upper left breast to bracket an area of calcification that was felt to be abnormal.  The neoprobe was set to I-125 and the 2 radioactive seeds were readily identified.  The area around this was infiltrated with quarter percent Marcaine.  A small incision was made along the upper edge of the areola of the left breast with a 15 blade knife.  The incision was carried through the skin and subcutaneous tissue sharply with the electrocautery.  The inferior seed was subareolar.  The superior seed was just above the areola in the upper portion of the left breast.  The 2 seeds were identified with the neoprobe.  A long thin lumpectomy was then created by dissecting around the 2 radioactive seeds while checking the area of radioactivity frequently.  The tissue between the 2 seeds was included in the lumpectomy.   Once the specimen was removed from the patient and it was oriented with the appropriate paint colors.  A specimen radiograph was obtained that showed the 2 clips and 1 seed to be within the specimen.  The second seed was at the inferior edge of what I took out with the lumpectomy.  An additional inferior margin was taken sharply with electrocautery and marked appropriately.  A specimen radiograph confirmed that the second seed was removed.  All of the tissue was then sent to pathology for further evaluation.  Hemostasis was achieved using the Bovie electrocautery.  The wound was irrigated with saline and infiltrated with more quarter percent Marcaine.  The deep layer of the incision was then closed with layers of interrupted 3-0 Vicryl stitches.  The skin was then closed with interrupted 4-0 Monocryl subcuticular stitches.  Dermabond dressings were applied.  The patient tolerated the procedure well.  At the end of the case all needle sponge and instrument counts were correct.  The patient was then awakened and taken to recovery in stable condition.  PLAN OF CARE: Discharge to home after PACU  PATIENT DISPOSITION:  PACU - hemodynamically stable.   Delay start of Pharmacological VTE agent (>24hrs) due to surgical blood loss or risk of bleeding: not applicable

## 2022-11-24 NOTE — Anesthesia Postprocedure Evaluation (Signed)
Anesthesia Post Note  Patient: Kristy Murillo  Procedure(s) Performed: LEFT BREAST BRACKETED LUMPECTOMY WITH RADIOACTIVE SEED LOCALIZATION (Left: Breast)     Patient location during evaluation: PACU Anesthesia Type: General Level of consciousness: awake and alert and oriented Pain management: pain level controlled Vital Signs Assessment: post-procedure vital signs reviewed and stable Respiratory status: spontaneous breathing, nonlabored ventilation and respiratory function stable Cardiovascular status: blood pressure returned to baseline and stable Postop Assessment: no apparent nausea or vomiting Anesthetic complications: no   No notable events documented.  Last Vitals:  Vitals:   11/24/22 0830 11/24/22 0845  BP: 118/74 108/75  Pulse: 88 88  Resp: 20 13  Temp:    SpO2: 100% 97%    Last Pain:  Vitals:   11/24/22 0845  TempSrc:   PainSc: 0-No pain                 Harshal Sirmon A.

## 2022-11-24 NOTE — Interval H&P Note (Signed)
History and Physical Interval Note:  11/24/2022 7:13 AM  Kristy Murillo  has presented today for surgery, with the diagnosis of LEFT BREAST CALCIFICATION.  The various methods of treatment have been discussed with the patient and family. After consideration of risks, benefits and other options for treatment, the patient has consented to  Procedure(s): LEFT BREAST BRACKETED LUMPECTOMY WITH RADIOACTIVE SEED LOCALIZATION (Left) as a surgical intervention.  The patient's history has been reviewed, patient examined, no change in status, stable for surgery.  I have reviewed the patient's chart and labs.  Questions were answered to the patient's satisfaction.     Chevis Pretty III

## 2022-11-24 NOTE — H&P (Signed)
REFERRING PHYSICIAN: Allean Found, * PROVIDER: Lindell Noe, MD MRN: [id number] DOB: 1955-05-23 Subjective   Chief Complaint: New Consultation (LFT BREAST)  History of Present Illness: Kristy Murillo is a 67 y.o. female who is seen today as an office consultation for evaluation of New Consultation (LFT BREAST)  We are asked to see the patient in consultation by Dr. Merri Brunette to evaluate her for new calcifications in the left breast. The patient is a 67 year old white female who recently went for a routine screening mammogram. At that time she was found to have a linear area of 6 cm of calcification in the upper left breast that looked abnormal. 2 areas from this were biopsied and both came back as benign fibrocystic tissue. The radiologist felt that this was discordant and recommended excision of the area. She does have a family history of breast cancer in her mother. She is otherwise healthy and does not smoke.  Review of Systems: A complete review of systems was obtained from the patient. I have reviewed this information and discussed as appropriate with the patient. See HPI as well for other ROS.  ROS   Medical History: Past Medical History:  Diagnosis Date  Arthritis   Patient Active Problem List  Diagnosis  Calcification of left breast   Past Surgical History:  Procedure Laterality Date  HYSTERECTOMY  LAPAROSCOPIC COLON RESECTION    Allergies  Allergen Reactions  Codeine Nausea   Current Outpatient Medications on File Prior to Visit  Medication Sig Dispense Refill  sertraline (ZOLOFT) 50 MG tablet Take 50 mg by mouth once daily   No current facility-administered medications on file prior to visit.   History reviewed. No pertinent family history.   Social History   Tobacco Use  Smoking Status Never  Smokeless Tobacco Never    Social History   Socioeconomic History  Marital status: Married  Tobacco Use  Smoking status: Never  Smokeless  tobacco: Never  Vaping Use  Vaping status: Never Used  Substance and Sexual Activity  Alcohol use: Yes  Drug use: Never   Objective:   Vitals:   BP: 110/72  Pulse: 83  Temp: 36.8 C (98.3 F)  SpO2: 98%  Weight: 67.2 kg (148 lb 3.2 oz)  Height: 174.6 cm (5' 8.75")  PainSc: 0-No pain   Body mass index is 22.04 kg/m.  Physical Exam Vitals reviewed.  Constitutional:  General: She is not in acute distress. Appearance: Normal appearance.  HENT:  Head: Normocephalic and atraumatic.  Right Ear: External ear normal.  Left Ear: External ear normal.  Nose: Nose normal.  Mouth/Throat:  Mouth: Mucous membranes are moist.  Pharynx: Oropharynx is clear.  Eyes:  General: No scleral icterus. Extraocular Movements: Extraocular movements intact.  Conjunctiva/sclera: Conjunctivae normal.  Pupils: Pupils are equal, round, and reactive to light.  Cardiovascular:  Rate and Rhythm: Normal rate and regular rhythm.  Pulses: Normal pulses.  Heart sounds: Normal heart sounds.  Pulmonary:  Effort: Pulmonary effort is normal. No respiratory distress.  Breath sounds: Normal breath sounds.  Abdominal:  General: Bowel sounds are normal.  Palpations: Abdomen is soft.  Tenderness: There is no abdominal tenderness.  Musculoskeletal:  General: No swelling, tenderness or deformity. Normal range of motion.  Cervical back: Normal range of motion and neck supple.  Skin: General: Skin is warm and dry.  Coloration: Skin is not jaundiced.  Neurological:  General: No focal deficit present.  Mental Status: She is alert and oriented to person, place, and time.  Psychiatric:  Mood and Affect: Mood normal.  Behavior: Behavior normal.     Breast: There is a palpable bruise in the upper portion of the left breast that is small. Other than this there is no palpable mass in either breast. There is no palpable axillary, supraclavicular, or cervical lymphadenopathy.  Labs, Imaging and Diagnostic  Testing:  Assessment and Plan:   Diagnoses and all orders for this visit:  Calcification of left breast - [facility] Case Posting Request; Future   The patient appears to have a 6 x 3 x 1 cm area of calcification in the upper portion of the left breast that is abnormal. The pathology was benign but the radiologist felt that this was discordant. Because of this the recommendation is to have this area excised. I have discussed with her in detail the risks and benefits of the operation as well as some of the technical aspects including the use of 2 radioactive seeds to bracket the area and she understands and wishes to proceed.

## 2022-11-24 NOTE — Transfer of Care (Signed)
Immediate Anesthesia Transfer of Care Note  Patient: Kristy Murillo  Procedure(s) Performed: LEFT BREAST BRACKETED LUMPECTOMY WITH RADIOACTIVE SEED LOCALIZATION (Left: Breast)  Patient Location: PACU  Anesthesia Type:General  Level of Consciousness: awake, alert , and oriented  Airway & Oxygen Therapy: Patient Spontanous Breathing and Patient connected to face mask oxygen  Post-op Assessment: Report given to RN and Post -op Vital signs reviewed and stable  Post vital signs: Reviewed and stable  Last Vitals:  Vitals Value Taken Time  BP 118/65 11/24/22 0823  Temp 36.2 C 11/24/22 0823  Pulse 88 11/24/22 0827  Resp 15 11/24/22 0827  SpO2 100 % 11/24/22 0827  Vitals shown include unfiled device data.  Last Pain:  Vitals:   11/24/22 0702  TempSrc: Temporal  PainSc: 0-No pain      Patients Stated Pain Goal: 3 (11/24/22 1610)  Complications: No notable events documented.

## 2022-11-25 ENCOUNTER — Encounter (HOSPITAL_BASED_OUTPATIENT_CLINIC_OR_DEPARTMENT_OTHER): Payer: Self-pay | Admitting: General Surgery

## 2022-12-18 DIAGNOSIS — Z6822 Body mass index (BMI) 22.0-22.9, adult: Secondary | ICD-10-CM | POA: Diagnosis not present

## 2022-12-18 DIAGNOSIS — Z01419 Encounter for gynecological examination (general) (routine) without abnormal findings: Secondary | ICD-10-CM | POA: Diagnosis not present

## 2022-12-24 ENCOUNTER — Ambulatory Visit (HOSPITAL_BASED_OUTPATIENT_CLINIC_OR_DEPARTMENT_OTHER)
Admission: RE | Admit: 2022-12-24 | Discharge: 2022-12-24 | Disposition: A | Discharge status: Home / Self Care | Payer: Commercial Managed Care - HMO | Source: Ambulatory Visit | Attending: Family Medicine | Admitting: Family Medicine

## 2022-12-24 ENCOUNTER — Encounter (HOSPITAL_BASED_OUTPATIENT_CLINIC_OR_DEPARTMENT_OTHER): Payer: Self-pay

## 2022-12-24 DIAGNOSIS — Z8249 Family history of ischemic heart disease and other diseases of the circulatory system: Secondary | ICD-10-CM | POA: Insufficient documentation

## 2022-12-25 DIAGNOSIS — N958 Other specified menopausal and perimenopausal disorders: Secondary | ICD-10-CM | POA: Diagnosis not present

## 2022-12-25 DIAGNOSIS — M8588 Other specified disorders of bone density and structure, other site: Secondary | ICD-10-CM | POA: Diagnosis not present

## 2023-01-07 ENCOUNTER — Telehealth: Payer: Self-pay | Admitting: Hematology and Oncology

## 2023-01-07 NOTE — Telephone Encounter (Signed)
Scheduled appointment per referral and incoming call. Patient is aware of the appointment time and date as well as the address. Patient was informed to arrive 10-15 minutes prior with updated insurance information. All questions were answered.

## 2023-01-27 ENCOUNTER — Inpatient Hospital Stay: Payer: Medicare HMO

## 2023-01-27 ENCOUNTER — Inpatient Hospital Stay: Payer: Medicare HMO | Attending: Hematology and Oncology | Admitting: Hematology and Oncology

## 2023-01-27 VITALS — BP 102/65 | HR 86 | Temp 97.7°F | Resp 16 | Wt 148.0 lb

## 2023-01-27 DIAGNOSIS — N6099 Unspecified benign mammary dysplasia of unspecified breast: Secondary | ICD-10-CM | POA: Insufficient documentation

## 2023-01-27 DIAGNOSIS — Z803 Family history of malignant neoplasm of breast: Secondary | ICD-10-CM | POA: Diagnosis not present

## 2023-01-27 NOTE — Progress Notes (Unsigned)
[facility] CONSULT NOTE  Patient Care Team: Merri Brunette, MD as PCP - General (Family Medicine)  CHIEF COMPLAINTS/PURPOSE OF CONSULTATION:  Newly diagnosed breast cancer  HISTORY OF PRESENTING ILLNESS:  Verdis Prime 67 y.o. 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 She has a family history of breast cancer, with her mother having passed away from the disease at the age of 41. The patient is currently asymptomatic but is concerned about her increased risk of breast cancer due to her condition and family history. She is considering starting on a low dose of tamoxifen, a medication that can reduce the risk of breast cancer. The patient also has osteopenia and is concerned about the potential impact of the medications on her bone density. She otherwise is very healthy, exercises regularly, eats healthy.   Rest of the pertinent 10 point ROS reviewed and negative  MEDICAL HISTORY:  Past Medical History:  Diagnosis Date   Anxiety    Endometriosis    PONV (postoperative nausea and vomiting)     SURGICAL HISTORY: Past Surgical History:  Procedure Laterality Date   ABDOMINAL HYSTERECTOMY     BREAST BIOPSY Left 08/25/2022   MM LT BREAST BX W LOC DEV 1ST LESION IMAGE BX SPEC STEREO GUIDE 08/25/2022 [facility]   BREAST BIOPSY Left 08/25/2022   MM LT BREAST BX W LOC DEV EA AD LESION IMG BX SPEC STEREO GUIDE 08/25/2022 [facility]   BREAST BIOPSY  11/21/2022   MM LT RADIOACTIVE SEED LOC MAMMO GUIDE 11/21/2022 [facility]   BREAST BIOPSY  11/21/2022   MM LT RADIOACTIVE SEED EA ADD LESION LOC MAMMO GUIDE 11/21/2022 [facility]   BREAST LUMPECTOMY WITH RADIOACTIVE SEED LOCALIZATION Left 11/24/2022   Procedure: LEFT BREAST BRACKETED LUMPECTOMY WITH RADIOACTIVE SEED LOCALIZATION;  Surgeon: Griselda Miner, MD;   Location: [facility];  Service: General;  Laterality: Left;   COLON RESECTION N/A 05/01/2016   Procedure: EXPLORATORY LAPAROTOMY WITH PARTIAL COLECTOMY;  Surgeon: Darnell Level, MD;  Location: [facility];  Service: General;  Laterality: N/A;   TONSILLECTOMY      SOCIAL HISTORY: Social History   Socioeconomic History   Marital status: Married    Spouse name: Not on file   Number of children: Not on file   Years of education: Not on file   Highest education level: Not on file  Occupational History   Not on file  Tobacco Use   Smoking status: Never   Smokeless tobacco: Never  Substance and Sexual Activity   Alcohol use: Yes    Alcohol/week: 1.0 standard drink of alcohol    Types: 1 Glasses of wine per week   Drug use: Never   Sexual activity: Not on file  Other Topics Concern   Not on file  Social History Narrative   Not on file   Social Determinants of Health   Financial Resource Strain: Not on file  Food Insecurity: Not on file  Transportation Needs: Not on file  Physical Activity: Not on file  Stress: Not on file  Social Connections: Not on file  Intimate Partner Violence: Not on file    FAMILY HISTORY: No family history on file.  ALLERGIES:  is allergic to codeine.  MEDICATIONS:  Current Outpatient Medications  Medication Sig Dispense Refill   carboxymethylcellulose (REFRESH PLUS) 0.5 % SOLN Place  1 drop into both eyes daily.     cholecalciferol (VITAMIN D) 1000 units tablet Take 1,000 Units by mouth daily.     Magnesium 100 MG TABS Take 100-200 mg by mouth at bedtime as needed (insomnia).     MELATONIN PO Take 2 tablets by mouth at bedtime as needed (insomnia).     sertraline (ZOLOFT) 50 MG tablet Take 50 mg by mouth daily.     traMADol (ULTRAM) 50 MG tablet Take 1 tablet (50 mg total) by mouth every 6 (six) hours as needed. 20 tablet 0   No current facility-administered medications for this visit.    REVIEW OF SYSTEMS:   Constitutional: Denies  fevers, chills or abnormal night sweats Eyes: Denies blurriness of vision, double vision or watery eyes Ears, nose, mouth, throat, and face: Denies mucositis or sore throat Respiratory: Denies cough, dyspnea or wheezes Cardiovascular: Denies palpitation, chest discomfort or lower extremity swelling Gastrointestinal:  Denies nausea, heartburn or change in bowel habits Skin: Denies abnormal skin rashes Lymphatics: Denies new lymphadenopathy or easy bruising Neurological:Denies numbness, tingling or new weaknesses Behavioral/Psych: Mood is stable, no new changes  All other systems were reviewed with the patient and are negative.  PHYSICAL EXAMINATION: ECOG PERFORMANCE STATUS: 0 - Asymptomatic  Vitals:   01/27/23 1444  BP: 102/65  Pulse: 86  Resp: 16  Temp: 97.7 F (36.5 C)  SpO2: 100%   Filed Weights   01/27/23 1444  Weight: 148 lb (67.1 kg)    GENERAL:alert, no distress and comfortable Bilateral breast inspected and palpated.  No palpable masses.  No regional adenopathy  LABORATORY DATA:  I have reviewed the data as listed Lab Results  Component Value Date   WBC 6.8 05/06/2016   HGB 9.2 (L) 05/06/2016   HCT 27.3 (L) 05/06/2016   MCV 88.1 05/06/2016   PLT 255 05/06/2016   Lab Results  Component Value Date   NA 136 05/04/2016   K 4.2 05/04/2016   CL 104 05/04/2016   CO2 30 05/04/2016    RADIOGRAPHIC STUDIES: I have personally reviewed the radiological reports and agreed with the findings in the report.  ASSESSMENT AND PLAN:  Atypical ductal hyperplasia of breast This is a very pleasant 67 year old postmenopausal female patient with past medical history significant for endometriosis, family history significant for mom with metastatic breast cancer who passed away from it at the age of 65 recently was found to have atypical ductal hyperplasia and hence referred to high risk breast clinic.  Pathology review: I discussed the difference between atypical ductal  hyperplasia, DCIS and invasive breast cancer. I explained to her that atypical ductal hyperplasia  is characterized by a proliferation of uniform epithelial cells filling part, but not the entirety, of the involved duct. ADH is associated with an increased risk of both ipsilateral and contralateral breast cancer and thus provides evidence of underlying breast abnormalities that predispose to breast cancer.   Atypical Ductal Hyperplasia   Increased risk for breast cancer. Discussed the progression from atypical hyperplasia to ductal carcinoma in situ to invasive ductal cancer. Discussed the benefits and side effects of Tamoxifen and Anastrozole for breast cancer prevention. Patient has a family history of breast cancer in mom.   -Consider starting Tamoxifen 5mg  daily for breast cancer prevention.  We have discussed about both tamoxifen and anastrozole, mechanism of action with each class, adverse effects with each class including but not limited to postmenopausal symptoms such as hot flashes, vaginal discharge, increased risk of DVT/PE, endometrial complications  which did not apply to this patient since she had hysterectomy and a benefit on bone density from tamoxifen.  Printed information was given to patient for further review on tamoxifen. She wants to think about it, we will call her in about 3 to 4 weeks to see if she is interested in pursuing tamoxifen.  At this time she is not interested in pursuing aromatase inhibitor since she is very concerned about her underlying osteopenia. -Continue regular mammograms and consider alternating with MRI for more frequent screening if approved by insurance.   -Continue healthy lifestyle habits for primary prevention.    All her questions were answered to the best of my knowledge.  Thank you for consulting [place] in the care of this patient.  Please do not hesitate to contact us with any additional questions or concerns.  Total time spent: 45 min including history,  physical exam, review of medical records, counseling and coordination of care All questions were answered. The patient knows to call the clinic with any problems, questions or concerns.    Rachel Moulds, MD 01/28/23

## 2023-01-28 ENCOUNTER — Telehealth: Payer: Self-pay | Admitting: Hematology and Oncology

## 2023-01-28 DIAGNOSIS — N6099 Unspecified benign mammary dysplasia of unspecified breast: Secondary | ICD-10-CM | POA: Insufficient documentation

## 2023-01-28 NOTE — Telephone Encounter (Signed)
Left patient a vm regarding upcoming appointments  

## 2023-01-28 NOTE — Assessment & Plan Note (Signed)
This is a very pleasant 67 year old postmenopausal female patient with past medical history significant for endometriosis, family history significant for mom with metastatic breast cancer who passed away from it at the age of 34 recently was found to have atypical ductal hyperplasia and hence referred to high risk breast clinic.  Pathology review: I discussed the difference between atypical ductal hyperplasia, DCIS and invasive breast cancer. I explained to her that atypical ductal hyperplasia  is characterized by a proliferation of uniform epithelial cells filling part, but not the entirety, of the involved duct. ADH is associated with an increased risk of both ipsilateral and contralateral breast cancer and thus provides evidence of underlying breast abnormalities that predispose to breast cancer.   Atypical Ductal Hyperplasia   Increased risk for breast cancer. Discussed the progression from atypical hyperplasia to ductal carcinoma in situ to invasive ductal cancer. Discussed the benefits and side effects of Tamoxifen and Anastrozole for breast cancer prevention. Patient has a family history of breast cancer in mom.   -Consider starting Tamoxifen 5mg  daily for breast cancer prevention.  We have discussed about both tamoxifen and anastrozole, mechanism of action with each class, adverse effects with each class including but not limited to postmenopausal symptoms such as hot flashes, vaginal discharge, increased risk of DVT/PE, endometrial complications which did not apply to this patient since she had hysterectomy and a benefit on bone density from tamoxifen.  Printed information was given to patient for further review on tamoxifen. She wants to think about it, we will call her in about 3 to 4 weeks to see if she is interested in pursuing tamoxifen.  At this time she is not interested in pursuing aromatase inhibitor since she is very concerned about her underlying osteopenia. -Continue regular mammograms  and consider alternating with MRI for more frequent screening if approved by insurance.   -Continue healthy lifestyle habits for primary prevention.    All her questions were answered to the best of my knowledge.  Thank you for consulting [place] in the care of this patient.  Please do not hesitate to contact us with any additional questions or concerns.

## 2023-02-05 NOTE — Plan of Care (Signed)
[facility] Tonsillectomy/Adenoidectomy, Postoperative PEDS care plan entered in error.

## 2023-02-25 ENCOUNTER — Encounter: Payer: Self-pay | Admitting: Hematology and Oncology

## 2023-02-25 ENCOUNTER — Inpatient Hospital Stay: Payer: Medicare HMO | Attending: Hematology and Oncology | Admitting: Hematology and Oncology

## 2023-02-25 DIAGNOSIS — N6099 Unspecified benign mammary dysplasia of unspecified breast: Secondary | ICD-10-CM

## 2023-02-25 MED ORDER — TAMOXIFEN CITRATE 10 MG PO TABS: 5.0000 mg | ORAL_TABLET | Freq: Every day | ORAL | 1 refills | Status: DC

## 2023-02-25 NOTE — Progress Notes (Signed)
[name] CONSULT NOTE  Patient Care Team: Merri Brunette, MD as PCP - General (Family Medicine)  CHIEF COMPLAINTS/PURPOSE OF CONSULTATION:  Newly diagnosed breast cancer  HISTORY OF PRESENTING ILLNESS:  Kristy Murillo 68 y.o. 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 She has a family history of breast cancer, with her mother having passed away from the disease at the age of 74.   She is here for telephone visit to discuss once again about initiating tamoxifen.  She tells me that she is okay to start using low-dose of tamoxifen.  She realized that there is an interaction with Zoloft causing increased clearance of tamoxifen but she is willing to try the low-dose tamoxifen.  She tells me that she has been on Zoloft for several years. Rest of the pertinent 10 point ROS reviewed and negative  MEDICAL HISTORY:  Past Medical History:  Diagnosis Date   Anxiety    Endometriosis    PONV (postoperative nausea and vomiting)     SURGICAL HISTORY: Past Surgical History:  Procedure Laterality Date   ABDOMINAL HYSTERECTOMY     BREAST BIOPSY Left 08/25/2022   MM LT BREAST BX W LOC DEV 1ST LESION IMAGE BX SPEC STEREO GUIDE 08/25/2022 [facility]   BREAST BIOPSY Left 08/25/2022   MM LT BREAST BX W LOC DEV EA AD LESION IMG BX SPEC STEREO GUIDE 08/25/2022 [facility]   BREAST BIOPSY  11/21/2022   MM LT RADIOACTIVE SEED LOC MAMMO GUIDE 11/21/2022 [facility]   BREAST BIOPSY  11/21/2022   MM LT RADIOACTIVE SEED EA ADD LESION LOC MAMMO GUIDE 11/21/2022 [facility]   BREAST LUMPECTOMY WITH RADIOACTIVE SEED LOCALIZATION Left 11/24/2022   Procedure: LEFT BREAST BRACKETED LUMPECTOMY WITH RADIOACTIVE SEED LOCALIZATION;  Surgeon: Griselda Miner, MD;  Location: [facility];  Service: General;  Laterality:  Left;   COLON RESECTION N/A 05/01/2016   Procedure: EXPLORATORY LAPAROTOMY WITH PARTIAL COLECTOMY;  Surgeon: Darnell Level, MD;  Location: [facility];  Service: General;  Laterality: N/A;   TONSILLECTOMY      SOCIAL HISTORY: Social History   Socioeconomic History   Marital status: Married    Spouse name: Not on file   Number of children: Not on file   Years of education: Not on file   Highest education level: Not on file  Occupational History   Not on file  Tobacco Use   Smoking status: Never   Smokeless tobacco: Never  Substance and Sexual Activity   Alcohol use: Yes    Alcohol/week: 1.0 standard drink of alcohol    Types: 1 Glasses of wine per week   Drug use: Never   Sexual activity: Not on file  Other Topics Concern   Not on file  Social History Narrative   Not on file   Social Determinants of Health   Financial Resource Strain: Not on file  Food Insecurity: Not on file  Transportation Needs: Not on file  Physical Activity: Not on file  Stress: Not on file  Social Connections: Not on file  Intimate Partner Violence: Not on file    FAMILY HISTORY: History reviewed. No pertinent family history.  ALLERGIES:  is allergic to codeine.  MEDICATIONS:  Current Outpatient Medications  Medication Sig Dispense Refill   tamoxifen (NOLVADEX) 10 MG tablet Take 0.5 tablets (5 mg total) by  mouth daily. 90 tablet 1   carboxymethylcellulose (REFRESH PLUS) 0.5 % SOLN Place 1 drop into both eyes daily.     cholecalciferol (VITAMIN D) 1000 units tablet Take 1,000 Units by mouth daily.     Magnesium 100 MG TABS Take 100-200 mg by mouth at bedtime as needed (insomnia).     MELATONIN PO Take 2 tablets by mouth at bedtime as needed (insomnia).     sertraline (ZOLOFT) 50 MG tablet Take 50 mg by mouth daily.     traMADol (ULTRAM) 50 MG tablet Take 1 tablet (50 mg total) by mouth every 6 (six) hours as needed. 20 tablet 0   No current facility-administered medications for this visit.     REVIEW OF SYSTEMS:   Constitutional: Denies fevers, chills or abnormal night sweats Eyes: Denies blurriness of vision, double vision or watery eyes Ears, nose, mouth, throat, and face: Denies mucositis or sore throat Respiratory: Denies cough, dyspnea or wheezes Cardiovascular: Denies palpitation, chest discomfort or lower extremity swelling Gastrointestinal:  Denies nausea, heartburn or change in bowel habits Skin: Denies abnormal skin rashes Lymphatics: Denies new lymphadenopathy or easy bruising Neurological:Denies numbness, tingling or new weaknesses Behavioral/Psych: Mood is stable, no new changes  All other systems were reviewed with the patient and are negative.  PHYSICAL EXAMINATION: ECOG PERFORMANCE STATUS: 0 - Asymptomatic  There were no vitals filed for this visit.  There were no vitals filed for this visit.   GENERAL:alert, no distress and comfortable Physical exam deferred, telephone visit  LABORATORY DATA:  I have reviewed the data as listed Lab Results  Component Value Date   WBC 6.8 05/06/2016   HGB 9.2 (L) 05/06/2016   HCT 27.3 (L) 05/06/2016   MCV 88.1 05/06/2016   PLT 255 05/06/2016   Lab Results  Component Value Date   NA 136 05/04/2016   K 4.2 05/04/2016   CL 104 05/04/2016   CO2 30 05/04/2016    RADIOGRAPHIC STUDIES: I have personally reviewed the radiological reports and agreed with the findings in the report.  ASSESSMENT AND PLAN:  Atypical ductal hyperplasia of breast This is a very pleasant 67 year old postmenopausal female patient with past medical history significant for endometriosis, family history significant for mom with metastatic breast cancer who passed away from it at the age of 53 recently was found to have atypical ductal hyperplasia and hence referred to high risk breast clinic. During her last visit, we have discussed about tamoxifen 5 mg daily for breast cancer prevention.  I have clearly reviewed the mechanism of action,  adverse effects including but not limited to risk of DVT/PE, endometrial complications although this does not apply to the patient and benefit on bone density.  We have today reviewed the interaction between tamoxifen and Zoloft as well may cause increased clearance of tamoxifen thereby reducing the efficacy.  We however discussed that it is okay to consider starting at 5 mg and monitor for any adverse effects.  She will return to clinic in about 6 months or sooner as needed.  She was clearly instructed to call us with any asymmetrical lower extremity swelling, shortness of breath or other concerns.   Total time spent: 8 min including history, physical exam, review of medical records, counseling and coordination of care  I connected with  Kristy Murillo on 02/25/23 by a video enabled telemedicine application and verified that I am speaking with the correct person using two identifiers.   I discussed the limitations of evaluation and management by  telemedicine. The patient expressed understanding and agreed to proceed.  All questions were answered. The patient knows to call the clinic with any problems, questions or concerns.    Rachel Moulds, MD 02/25/23

## 2023-02-25 NOTE — Assessment & Plan Note (Signed)
This is a very pleasant 67 year old postmenopausal female patient with past medical history significant for endometriosis, family history significant for mom with metastatic breast cancer who passed away from it at the age of 85 recently was found to have atypical ductal hyperplasia and hence referred to high risk breast clinic. During her last visit, we have discussed about tamoxifen 5 mg daily for breast cancer prevention.  I have clearly reviewed the mechanism of action, adverse effects including but not limited to risk of DVT/PE, endometrial complications although this does not apply to the patient and benefit on bone density.  We have today reviewed the interaction between tamoxifen and Zoloft as well may cause increased clearance of tamoxifen thereby reducing the efficacy.  We however discussed that it is okay to consider starting at 5 mg and monitor for any adverse effects.  She will return to clinic in about 6 months or sooner as needed.  She was clearly instructed to call us with any asymmetrical lower extremity swelling, shortness of breath or other concerns.

## 2023-02-26 ENCOUNTER — Telehealth: Payer: Self-pay | Admitting: Hematology and Oncology

## 2023-02-26 NOTE — Telephone Encounter (Signed)
Spoke with patient confirming upcoming appointment  

## 2023-03-04 ENCOUNTER — Encounter: Payer: Self-pay | Admitting: Family Medicine

## 2023-03-09 ENCOUNTER — Ambulatory Visit (HOSPITAL_COMMUNITY)
Admission: RE | Admit: 2023-03-09 | Discharge: 2023-03-09 | Disposition: A | Discharge status: Home / Self Care | Payer: Medicare HMO | Source: Ambulatory Visit | Attending: Cardiology | Admitting: Cardiology

## 2023-03-09 ENCOUNTER — Other Ambulatory Visit (HOSPITAL_COMMUNITY): Payer: Self-pay | Admitting: Sports Medicine

## 2023-03-09 DIAGNOSIS — M79605 Pain in left leg: Secondary | ICD-10-CM | POA: Diagnosis not present

## 2023-03-09 DIAGNOSIS — M25562 Pain in left knee: Secondary | ICD-10-CM | POA: Diagnosis not present

## 2023-03-24 DIAGNOSIS — M25562 Pain in left knee: Secondary | ICD-10-CM | POA: Diagnosis not present

## 2023-05-05 DIAGNOSIS — M25562 Pain in left knee: Secondary | ICD-10-CM | POA: Diagnosis not present

## 2023-05-23 DIAGNOSIS — M545 Low back pain, unspecified: Secondary | ICD-10-CM | POA: Diagnosis not present

## 2023-05-25 DIAGNOSIS — M79605 Pain in left leg: Secondary | ICD-10-CM | POA: Diagnosis not present

## 2023-05-25 DIAGNOSIS — M5459 Other low back pain: Secondary | ICD-10-CM | POA: Diagnosis not present

## 2023-05-25 DIAGNOSIS — M5416 Radiculopathy, lumbar region: Secondary | ICD-10-CM | POA: Diagnosis not present

## 2023-05-25 DIAGNOSIS — M549 Dorsalgia, unspecified: Secondary | ICD-10-CM | POA: Diagnosis not present

## 2023-05-27 DIAGNOSIS — M5459 Other low back pain: Secondary | ICD-10-CM | POA: Diagnosis not present

## 2023-05-29 DIAGNOSIS — M5416 Radiculopathy, lumbar region: Secondary | ICD-10-CM | POA: Diagnosis not present

## 2023-06-02 DIAGNOSIS — M5416 Radiculopathy, lumbar region: Secondary | ICD-10-CM | POA: Diagnosis not present

## 2023-06-17 DIAGNOSIS — M545 Low back pain, unspecified: Secondary | ICD-10-CM | POA: Diagnosis not present

## 2023-06-17 DIAGNOSIS — M5416 Radiculopathy, lumbar region: Secondary | ICD-10-CM | POA: Diagnosis not present

## 2023-06-22 DIAGNOSIS — M5416 Radiculopathy, lumbar region: Secondary | ICD-10-CM | POA: Diagnosis not present

## 2023-06-22 DIAGNOSIS — G8929 Other chronic pain: Secondary | ICD-10-CM | POA: Diagnosis not present

## 2023-06-22 DIAGNOSIS — M5459 Other low back pain: Secondary | ICD-10-CM | POA: Diagnosis not present

## 2023-06-22 DIAGNOSIS — R200 Anesthesia of skin: Secondary | ICD-10-CM | POA: Diagnosis not present

## 2023-07-28 ENCOUNTER — Telehealth: Payer: Self-pay | Admitting: Hematology and Oncology

## 2023-07-28 NOTE — Telephone Encounter (Signed)
 Confirmed with pt rescheduled appt date and time

## 2023-08-26 ENCOUNTER — Ambulatory Visit: Payer: Medicare HMO | Admitting: Hematology and Oncology

## 2023-08-26 DIAGNOSIS — M25562 Pain in left knee: Secondary | ICD-10-CM | POA: Diagnosis not present

## 2023-08-31 DIAGNOSIS — S83232D Complex tear of medial meniscus, current injury, left knee, subsequent encounter: Secondary | ICD-10-CM | POA: Diagnosis not present

## 2023-08-31 DIAGNOSIS — M25562 Pain in left knee: Secondary | ICD-10-CM | POA: Diagnosis not present

## 2023-09-01 ENCOUNTER — Telehealth: Payer: Self-pay

## 2023-09-01 NOTE — Telephone Encounter (Signed)
 Called to confirm appointment. Got voicemail and left a message for appointment on 5/21

## 2023-09-02 ENCOUNTER — Inpatient Hospital Stay: Admitting: Hematology and Oncology

## 2023-09-10 DIAGNOSIS — H5203 Hypermetropia, bilateral: Secondary | ICD-10-CM | POA: Diagnosis not present

## 2023-09-15 DIAGNOSIS — M25562 Pain in left knee: Secondary | ICD-10-CM | POA: Diagnosis not present

## 2023-09-15 DIAGNOSIS — Z0100 Encounter for examination of eyes and vision without abnormal findings: Secondary | ICD-10-CM | POA: Diagnosis not present

## 2023-12-22 DIAGNOSIS — Z01419 Encounter for gynecological examination (general) (routine) without abnormal findings: Secondary | ICD-10-CM | POA: Diagnosis not present

## 2023-12-22 DIAGNOSIS — Z6821 Body mass index (BMI) 21.0-21.9, adult: Secondary | ICD-10-CM | POA: Diagnosis not present

## 2023-12-31 ENCOUNTER — Other Ambulatory Visit: Payer: Self-pay | Admitting: Obstetrics and Gynecology

## 2023-12-31 DIAGNOSIS — Z1231 Encounter for screening mammogram for malignant neoplasm of breast: Secondary | ICD-10-CM

## 2024-01-05 DIAGNOSIS — M25562 Pain in left knee: Secondary | ICD-10-CM | POA: Diagnosis not present

## 2024-01-22 ENCOUNTER — Telehealth: Payer: Self-pay | Admitting: Hematology and Oncology

## 2024-01-22 NOTE — Telephone Encounter (Signed)
 Left voicemail for patient regarding rescheduled appointment on 01/28/2024. The appointment will be with NP instead of Dr. Loretha due to being on call.

## 2024-01-28 ENCOUNTER — Inpatient Hospital Stay: Attending: Adult Health | Admitting: Adult Health

## 2024-01-28 ENCOUNTER — Inpatient Hospital Stay: Payer: Medicare HMO | Admitting: Hematology and Oncology

## 2024-01-28 VITALS — BP 111/70 | HR 73 | Temp 98.4°F | Resp 16 | Wt 143.5 lb

## 2024-01-28 DIAGNOSIS — M810 Age-related osteoporosis without current pathological fracture: Secondary | ICD-10-CM | POA: Diagnosis not present

## 2024-01-28 DIAGNOSIS — N6099 Unspecified benign mammary dysplasia of unspecified breast: Secondary | ICD-10-CM

## 2024-01-28 DIAGNOSIS — N6092 Unspecified benign mammary dysplasia of left breast: Secondary | ICD-10-CM | POA: Diagnosis not present

## 2024-01-28 DIAGNOSIS — N6011 Diffuse cystic mastopathy of right breast: Secondary | ICD-10-CM | POA: Insufficient documentation

## 2024-01-28 DIAGNOSIS — N6012 Diffuse cystic mastopathy of left breast: Secondary | ICD-10-CM | POA: Insufficient documentation

## 2024-01-28 NOTE — Progress Notes (Signed)
 [facility] Cancer Follow up:    Claudene Pellet, MD [phone] [address] [place] [address]   DIAGNOSIS: Atypical ductal hyperplasia   SUMMARY OF HIGH RISK HISTORY: Status post left breast  lumpectomy on 11/24/2022 consistent with atypical ductal hyperplasia with calcifications, atypical lobular hyperplasia, complex sclerosing lesions.  Shared decision making plan:  Intensified screening: mammogram in 02/2024 and breast MRI in 08/2024  Risk reduction: Unable to tolerate tamoxifen  for risk reduction, declines aromatase inhibitor use  CURRENT THERAPY: observation  INTERVAL HISTORY:  Discussed the use of AI scribe software for clinical note transcription with the patient, who gave verbal consent to proceed.  History of Present Illness ROSSELYN Murillo is a 68 year old female with atypical ductal hyperplasia who presents for follow-up and screening options.  Atypical ductal hyperplasia was diagnosed in August 2024 following a concerning mammogram. A biopsy confirmed ADH/CSL.  She underwent lumpectomy.   She was on tamoxifen  for a few months but discontinued due to side effects.  She has osteoarthritis and experiences joint pain, which affects her consideration of medication options.    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  MEDICAL HISTORY: Past Medical History:  Diagnosis Date   Anxiety    Endometriosis    PONV (postoperative nausea and vomiting)     SURGICAL HISTORY: Past Surgical History:  Procedure Laterality Date   ABDOMINAL HYSTERECTOMY     BREAST BIOPSY Left 08/25/2022   MM LT BREAST BX W LOC DEV 1ST LESION IMAGE BX SPEC STEREO GUIDE 08/25/2022 [facility]   BREAST BIOPSY Left 08/25/2022   MM LT BREAST BX W LOC DEV EA AD LESION IMG BX SPEC STEREO GUIDE 08/25/2022 [facility]   BREAST BIOPSY  11/21/2022    MM LT RADIOACTIVE SEED LOC MAMMO GUIDE 11/21/2022 [facility]   BREAST BIOPSY  11/21/2022   MM LT RADIOACTIVE SEED EA ADD LESION LOC MAMMO GUIDE 11/21/2022 [facility]   BREAST LUMPECTOMY WITH RADIOACTIVE SEED LOCALIZATION Left 11/24/2022   Procedure: LEFT BREAST BRACKETED LUMPECTOMY WITH RADIOACTIVE SEED LOCALIZATION;  Surgeon: Curvin Deward MOULD, MD;  Location: [facility];  Service: General;  Laterality: Left;   COLON RESECTION N/A 05/01/2016   Procedure: EXPLORATORY LAPAROTOMY WITH PARTIAL COLECTOMY;  Surgeon: Krystal Spinner, MD;  Location: [facility];  Service: General;  Laterality: N/A;   TONSILLECTOMY      SOCIAL HISTORY: Social History   Socioeconomic History   Marital status: Married    Spouse name: Not on file   Number of children: Not on file   Years of education: Not on file   Highest education level: Not on file  Occupational History   Not on file  Tobacco Use   Smoking status: Never   Smokeless tobacco: Never  Substance and Sexual Activity   Alcohol  use: Yes    Alcohol /week: 1.0 standard drink of alcohol     Types: 1 Glasses of wine per week   Drug use: Never   Sexual activity: Not on file  Other Topics Concern   Not on file  Social History Narrative   Not on file   Social Drivers of Health   Financial Resource Strain: Not on file  Food Insecurity: Not on file  Transportation Needs: Not on file  Physical Activity: Not on file  Stress: Not on file  Social Connections: Not  on file  Intimate Partner Violence: Not on file    FAMILY HISTORY: No family history on file.  Review of Systems  Constitutional:  Negative for appetite change, chills, fatigue, fever and unexpected weight change.  HENT:   Negative for hearing loss, lump/mass and trouble swallowing.   Eyes:  Negative for eye problems and icterus.  Respiratory:  Negative for chest tightness, cough and shortness of breath.   Cardiovascular:  Negative for chest pain, leg swelling and  palpitations.  Gastrointestinal:  Negative for abdominal distention, abdominal pain, constipation, diarrhea, nausea and vomiting.  Endocrine: Negative for hot flashes.  Genitourinary:  Negative for difficulty urinating.   Musculoskeletal:  Negative for arthralgias.  Skin:  Negative for itching and rash.  Neurological:  Negative for dizziness, extremity weakness, headaches and numbness.  Hematological:  Negative for adenopathy. Does not bruise/bleed easily.  Psychiatric/Behavioral:  Negative for depression. The patient is not nervous/anxious.       PHYSICAL EXAMINATION    Vitals:   01/28/24 1204  BP: 111/70  Pulse: 73  Resp: 16  Temp: 98.4 F (36.9 C)  SpO2: 98%    Physical Exam Constitutional:      General: She is not in acute distress.    Appearance: Normal appearance. She is not toxic-appearing.  HENT:     Head: Normocephalic and atraumatic.  Eyes:     General: No scleral icterus. Chest:     Comments: Declines breast exam today, recent normal breast exam with gynecology last month Musculoskeletal:        General: No swelling.  Skin:    General: Skin is warm and dry.     Findings: No rash.  Neurological:     General: No focal deficit present.     Mental Status: She is alert.  Psychiatric:        Mood and Affect: Mood normal.        Behavior: Behavior normal.     LABORATORY DATA:  CBC     ASSESSMENT and THERAPY PLAN:   Assessment and Plan Assessment & Plan Atypical ductal hyperplasia of left breast ADH of the left breast increases breast cancer risk. Previous tamoxifen  use discontinued due to side effects. Fibrocystic breasts noted. - Order diagnostic mammogram at breast center. - Schedule breast MRI six months post-mammogram. - Cancel current screening mammogram appointment. - Consider annual high-risk visit for updated screening recommendations.  Osteoporosis Osteoporosis with joint pain. Discussed medication risks for joint pain and bone  strength. Noted risk factor: thin white woman.   RTC in 1 year for f/u.    All questions were answered. The patient knows to call the clinic with any problems, questions or concerns. We can certainly see the patient much sooner if necessary.  Total encounter time:20 minutes*in face-to-face visit time, chart review, lab review, care coordination, order entry, and documentation of the encounter time.    Morna Kendall, NP 02/01/24 3:09 PM Medical Oncology and Hematology [facility] [address] [place], [place] [address] Tel. [phone]    Fax. [phone]  *Total Encounter Time as defined by the Centers for Medicare and Medicaid Services includes, in addition to the face-to-face time of a patient visit (documented in the note above) non-face-to-face time: obtaining and reviewing outside history, ordering and reviewing medications, tests or procedures, care coordination (communications with other health care professionals or caregivers) and documentation in the medical record.

## 2024-02-13 DIAGNOSIS — N390 Urinary tract infection, site not specified: Secondary | ICD-10-CM | POA: Diagnosis not present

## 2024-02-16 ENCOUNTER — Ambulatory Visit
Admission: RE | Admit: 2024-02-16 | Discharge: 2024-02-16 | Disposition: A | Discharge status: Home / Self Care | Source: Ambulatory Visit | Attending: Adult Health | Admitting: Adult Health

## 2024-02-16 DIAGNOSIS — R928 Other abnormal and inconclusive findings on diagnostic imaging of breast: Secondary | ICD-10-CM | POA: Diagnosis not present

## 2024-02-16 DIAGNOSIS — N6099 Unspecified benign mammary dysplasia of unspecified breast: Secondary | ICD-10-CM

## 2024-03-31 ENCOUNTER — Ambulatory Visit

## 2024-08-23 ENCOUNTER — Other Ambulatory Visit

## 2025-01-27 ENCOUNTER — Inpatient Hospital Stay: Admitting: Hematology and Oncology
